# Patient Record
Sex: Male | Born: 1970 | Race: White | Hispanic: No | Marital: Single | Smoking: Never smoker
Health system: Southern US, Community
[De-identification: ages and names within clinical notes are randomized; demographics above are authoritative.]

## PROBLEM LIST (undated history)

## (undated) DIAGNOSIS — I10 Essential (primary) hypertension: Secondary | ICD-10-CM

## (undated) HISTORY — PX: ADENOIDECTOMY: SUR15

---
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
## 2005-07-22 ENCOUNTER — Emergency Department: Payer: Self-pay | Admitting: General Practice

## 2014-07-31 ENCOUNTER — Ambulatory Visit: Payer: Self-pay | Admitting: Family Medicine

## 2014-08-20 ENCOUNTER — Ambulatory Visit: Payer: Self-pay | Admitting: Family Medicine

## 2015-03-04 DIAGNOSIS — M545 Low back pain, unspecified: Secondary | ICD-10-CM | POA: Insufficient documentation

## 2015-08-28 ENCOUNTER — Telehealth: Payer: Self-pay

## 2015-08-28 ENCOUNTER — Ambulatory Visit
Admission: RE | Admit: 2015-08-28 | Discharge: 2015-08-28 | Disposition: A | Discharge status: Home / Self Care | Payer: BLUE CROSS/BLUE SHIELD | Source: Ambulatory Visit | Attending: Family Medicine | Admitting: Family Medicine

## 2015-08-28 ENCOUNTER — Ambulatory Visit (INDEPENDENT_AMBULATORY_CARE_PROVIDER_SITE_OTHER): Payer: BLUE CROSS/BLUE SHIELD | Admitting: Family Medicine

## 2015-08-28 ENCOUNTER — Other Ambulatory Visit: Payer: Self-pay | Admitting: Family Medicine

## 2015-08-28 ENCOUNTER — Encounter: Payer: Self-pay | Admitting: Family Medicine

## 2015-08-28 VITALS — BP 152/90 | HR 95 | Temp 98.2°F | Resp 16 | Wt 183.4 lb

## 2015-08-28 DIAGNOSIS — M5030 Other cervical disc degeneration, unspecified cervical region: Secondary | ICD-10-CM | POA: Insufficient documentation

## 2015-08-28 DIAGNOSIS — M542 Cervicalgia: Secondary | ICD-10-CM

## 2015-08-28 MED ORDER — ETODOLAC 200 MG PO CAPS: 200.0000 mg | ORAL_CAPSULE | Freq: Three times a day (TID) | ORAL | Status: DC

## 2015-08-28 MED ORDER — METHOCARBAMOL 750 MG PO TABS: 750.0000 mg | ORAL_TABLET | Freq: Four times a day (QID) | ORAL | Status: DC

## 2015-08-28 NOTE — Telephone Encounter (Signed)
Pt called because [facility] advised that [facility] didn't receive the RX for etodolac (LODINE) 200 MG capsule. [facility] called [facility] and was advised they didn't receive this RX but they did receive the other RX that was sent in for pt. [facility] stated that their e-scribe was messed up and asked that it be faxed in or called in and left on the voicemail. Thanks TNP

## 2015-08-28 NOTE — Patient Instructions (Signed)
Acute Torticollis °Torticollis is a condition in which the muscles of the neck tighten (contract) abnormally, causing the neck to twist and the head to move into an unnatural position. Torticollis that develops suddenly is called acute torticollis. If torticollis becomes chronic and is left untreated, the face and neck can become deformed. °CAUSES °This condition may be caused by: °· Sleeping in an awkward position (common). °· Extending or twisting the neck muscles beyond their normal position. °· Infection. °In some cases, the cause may not be known. °SYMPTOMS °Symptoms of this condition include: °· An unnatural position of the head. °· Neck pain. °· A limited ability to move the neck. °· Twisting of the neck to one side. °DIAGNOSIS °This condition is diagnosed with a physical exam. You may also have imaging tests, such as an X-ray, CT scan, or MRI. °TREATMENT °Treatment for this condition involves trying to relax the neck muscles. It may include: °· Medicines or shots. °· Physical therapy. °· Surgery. This may be done in severe cases. °HOME CARE INSTRUCTIONS °· Take medicines only as directed by your health care provider. °· Do stretching exercises and massage your neck as directed by your health care provider. °· Keep all follow-up visits as directed by your health care provider. This is important. °SEEK MEDICAL CARE IF: °· You develop a fever. °SEEK IMMEDIATE MEDICAL CARE IF: °· You develop difficulty breathing. °· You develop noisy breathing (stridor). °· You start drooling. °· You have trouble swallowing or have pain with swallowing. °· You develop numbness or weakness in your hands or feet. °· You have changes in your speech, understanding, or vision. °· Your pain gets worse. °  °This information is not intended to replace advice given to you by your health care provider. Make sure you discuss any questions you have with your health care provider. °  °Document Released: 07/08/2000 Document Revised:  11/25/2014 Document Reviewed: 07/07/2014 °Elsevier Interactive Patient Education ©2016 Elsevier Inc. ° °

## 2015-08-28 NOTE — Progress Notes (Signed)
Patient ID: Russell Simon, male   DOB: 02/13/1971, 45 y.o.   MRN: [id number]   Patient: Russell Simon Male    DOB: [birth year]   45 y.o.   MRN: [id number] Visit Date: 08/28/2015  Today's Provider: Dortha Kern, PA   Chief Complaint  Patient presents with  . Neck Pain   Subjective:    Neck Pain  This is a new problem. Episode onset: two days. The problem occurs constantly. The problem has been unchanged. The pain is associated with nothing. The pain is present in the midline. The quality of the pain is described as aching and shooting. The pain is at a severity of 7/10. The symptoms are aggravated by sneezing, bending, position and twisting (movements). The pain is same all the time. Stiffness is present all day. Pertinent negatives include no numbness, paresis or trouble swallowing. He has tried heat and NSAIDs for the symptoms. The treatment provided mild relief.  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 Has been at this job the past couple years.  Patient Active Problem List   Diagnosis Date Noted  . LBP (low back pain) 03/04/2015   Past Surgical History  Procedure Laterality Date  . Tonsillectomy and adenoidectomy  1983   Family History  Problem Relation Age of Onset  . Hypertension Mother   . Hypertension Father   . Urolithiasis Father   . Aneurysm Maternal Grandmother   . Diabetes Maternal Grandfather   . CVA Paternal Grandmother   . Lung cancer Paternal Grandfather      Previous Medications   No medications on file   No Known Allergies  Review of Systems  Constitutional: Negative.   HENT: Negative.  Negative for trouble swallowing.   Eyes: Negative.   Respiratory: Negative.   Cardiovascular: Negative.   Gastrointestinal: Negative.   Endocrine: Negative.   Genitourinary: Negative.   Musculoskeletal: Positive for neck pain.  Skin: Negative.   Allergic/Immunologic: Negative.   Neurological: Negative.  Negative for  numbness.  Hematological: Negative.   Psychiatric/Behavioral: Negative.     Social History  Substance Use Topics  . Smoking status: Never Smoker   . Smokeless tobacco: Never Used  . Alcohol Use: No   Objective:   BP 152/90 mmHg  Pulse 95  Temp(Src) 98.2 F (36.8 C) (Oral)  Resp 16  Wt 183 lb 6.4 oz (83.19 kg)  SpO2 98%  Physical Exam  Constitutional: He is oriented to person, place, and time. He appears well-developed and well-nourished.  HENT:  Head: Normocephalic.  Right Ear: External ear normal.  Left Ear: External ear normal.  Mouth/Throat: Oropharynx is clear and moist.  Eyes: Conjunctivae and EOM are normal.  Neck:  Stiff and slightly sore to palpate posterior neck and shoulder muscles. Unable to rotate head very far without spasms.  Cardiovascular: Normal rate and regular rhythm.   Pulmonary/Chest: Effort normal and breath sounds normal.  Abdominal: Soft. Bowel sounds are normal.  Musculoskeletal: He exhibits tenderness.  Cervical pain. No radiation of pain or numbness in arms or hands.  Lymphadenopathy:    He has no cervical adenopathy.  Neurological: He is alert and oriented to person, place, and time. He has normal reflexes.  Skin: No rash noted.      Assessment & Plan:     1. Cervical pain Onset over the past couple days. No specific injury known. Spasms and sharp pain worse with movement. History of some mild degenerative changes of cervical facets and end  plate spurring without disc space narrowing on C-spine x-ray on 08-20-14. Will give muscle relaxant and NSAID to use with heat applications and rest. No work for the next 4 days. Recheck C-spine x-ray this year for comparison and evaluate for progression of degenerative changes. Recheck pending x-ray report. - methocarbamol (ROBAXIN) 750 MG tablet; Take 1 tablet (750 mg total) by mouth 4 (four) times daily.  Dispense: 40 tablet; Refill: 0 - etodolac (LODINE) 200 MG capsule; Take 1 capsule (200 mg total) by  mouth every 8 (eight) hours.  Dispense: 30 capsule; Refill: 3 - DG Cervical Spine Complete

## 2015-08-28 NOTE — Telephone Encounter (Signed)
-----   Message from Tamsen Roers, [place] sent at 08/28/2015  1:47 PM EST ----- X-rays of neck essentially the same as last year. Try medications as prescribed and recheck if no better in 4-5 days.

## 2015-08-28 NOTE — Telephone Encounter (Signed)
Patient advised as directed below. Patient verbalized understanding.  

## 2015-08-28 NOTE — Telephone Encounter (Signed)
[facility]

## 2016-03-25 ENCOUNTER — Encounter: Payer: Self-pay | Admitting: Family Medicine

## 2016-03-25 ENCOUNTER — Ambulatory Visit (INDEPENDENT_AMBULATORY_CARE_PROVIDER_SITE_OTHER): Payer: BLUE CROSS/BLUE SHIELD | Admitting: Family Medicine

## 2016-03-25 VITALS — BP 122/70 | HR 66 | Temp 97.9°F | Resp 16 | Wt 176.8 lb

## 2016-03-25 DIAGNOSIS — H109 Unspecified conjunctivitis: Secondary | ICD-10-CM | POA: Diagnosis not present

## 2016-03-25 MED ORDER — SULFACETAMIDE SODIUM 10 % OP SOLN: 2.0000 [drp] | Freq: Four times a day (QID) | OPHTHALMIC | 0 refills | Status: DC

## 2016-03-25 NOTE — Progress Notes (Signed)
Subjective:     Patient ID: Russell Simon, male   DOB: 1971/06/08, 45 y.o.   MRN: [id number]  HPI  Chief Complaint  Patient presents with  . Conjunctivitis    Patient comes into office today with concerns of itching and redness of left eye for the past 2 days. Patient reports that he has had drainage and crusting of lid.  States he was not wearing his contacts prior to the onset of his sx. No changes in vision or contact with children. Has used Visine on one occasion.   Review of Systems     Objective:   Physical Exam  Constitutional: He appears well-developed and well-nourished. No distress.  Eyes: Pupils are equal, round, and reactive to light.  Left eye without drainage or f.b. Mild scleral injection       Assessment:    1. Conjunctivitis of left eye - sulfacetamide (BLEPH-10) 10 % ophthalmic solution; Place 2 drops into the left eye 4 (four) times daily.  Dispense: 15 mL; Refill: 0    Plan:    Discussed use of frequent warm compresses.

## 2016-03-25 NOTE — Patient Instructions (Addendum)
Discussed use of frequent warm, wet, compresses

## 2016-07-06 ENCOUNTER — Encounter: Payer: Self-pay | Admitting: Family Medicine

## 2016-07-06 ENCOUNTER — Ambulatory Visit (INDEPENDENT_AMBULATORY_CARE_PROVIDER_SITE_OTHER): Payer: BLUE CROSS/BLUE SHIELD | Admitting: Family Medicine

## 2016-07-06 VITALS — BP 138/98 | HR 85 | Temp 98.1°F | Resp 17 | Wt 178.0 lb

## 2016-07-06 DIAGNOSIS — J069 Acute upper respiratory infection, unspecified: Secondary | ICD-10-CM

## 2016-07-06 DIAGNOSIS — B9789 Other viral agents as the cause of diseases classified elsewhere: Secondary | ICD-10-CM | POA: Diagnosis not present

## 2016-07-06 MED ORDER — HYDROCODONE-HOMATROPINE 5-1.5 MG/5ML PO SYRP: ORAL_SOLUTION | ORAL | 0 refills | Status: DC

## 2016-07-06 NOTE — Progress Notes (Signed)
Subjective:     Patient ID: Russell Simon, male   DOB: [birth year], 45 y.o.   MRN: [id number]  HPI  Chief Complaint  Patient presents with  . Cough    Patient comes in office today with concerns of cough and congestion for the past week and half. Associated with cough patient complains of fatigue, he has taken otc Alka-Seltzer Cold and Flu for relief. Patient reports he was seen at [facility] when symptoms initially began and was prescribed Lidocaine and Tessalon.   States sinuses are clearing and cough is minimally productive. States he left early from work last night.   Review of Systems     Objective:   Physical Exam  Constitutional: He appears well-developed and well-nourished. No distress.  Ears: T.M's intact without inflammation Sinuses: non-tender Throat:tonsils absent Neck: no cervical adenopathy Lungs: clear     Assessment:    1. Viral upper respiratory tract infection - HYDROcodone-homatropine (HYCODAN) 5-1.5 MG/5ML syrup; 5 ml 4-6 hours as needed for cough  Dispense: 240 mL; Refill: 0    Plan:    Discussed use of Mucinex D and Delsym. Work excuse provided for 12/12 to 12/13.

## 2016-07-06 NOTE — Patient Instructions (Signed)
Add Mucinex D for residual congestion and Delsym for cough if not using hydrocodone.

## 2016-07-22 ENCOUNTER — Other Ambulatory Visit: Payer: Self-pay | Admitting: Family Medicine

## 2016-07-22 ENCOUNTER — Telehealth: Payer: Self-pay | Admitting: Family Medicine

## 2016-07-22 DIAGNOSIS — J40 Bronchitis, not specified as acute or chronic: Secondary | ICD-10-CM

## 2016-07-22 MED ORDER — AZITHROMYCIN 250 MG PO TABS: ORAL_TABLET | ORAL | 0 refills | Status: DC

## 2016-07-22 NOTE — Telephone Encounter (Signed)
Let him know I sent in an antibiotic to his pharmacy

## 2016-07-22 NOTE — Telephone Encounter (Signed)
Patient advised.

## 2016-07-22 NOTE — Telephone Encounter (Signed)
Pt was in a couple of weeks ago and seen Nadine CountsBob for cough and congestio.  He is still coughing and wants to know if there is anything else he can do.  He is still taking the cough rx hydocan  His call back is [phone]  [facility]  Thanks Barth Kirkseri

## 2017-02-16 ENCOUNTER — Ambulatory Visit: Payer: BLUE CROSS/BLUE SHIELD | Admitting: Family Medicine

## 2017-02-16 ENCOUNTER — Encounter: Payer: Self-pay | Admitting: Family Medicine

## 2017-02-16 ENCOUNTER — Other Ambulatory Visit: Payer: Self-pay | Admitting: Family Medicine

## 2017-02-16 ENCOUNTER — Ambulatory Visit (INDEPENDENT_AMBULATORY_CARE_PROVIDER_SITE_OTHER): Payer: BLUE CROSS/BLUE SHIELD | Admitting: Family Medicine

## 2017-02-16 VITALS — BP 144/102 | HR 79 | Temp 98.4°F | Resp 16 | Wt 170.2 lb

## 2017-02-16 DIAGNOSIS — L309 Dermatitis, unspecified: Secondary | ICD-10-CM | POA: Diagnosis not present

## 2017-02-16 MED ORDER — TRIAMCINOLONE ACETONIDE 0.1 % EX CREA: 1.0000 "application " | TOPICAL_CREAM | Freq: Two times a day (BID) | CUTANEOUS | 0 refills | Status: DC

## 2017-02-16 NOTE — Progress Notes (Signed)
Subjective:     Patient ID: Russell Simon, male   DOB: [birth year], 46 y.o.   MRN: [id number]  HPI  Chief Complaint  Patient presents with  . Rash    Patient comes in office today with concerns of a possible rash that appeared on his ankles 6 days ago. Patient describes rash as burning and denies that it has spreaded.   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 Wears low level boots with ankle socks. Rash appeared after his third shift on 7/20. Feels like it resolved over the weekend then recurred when he returned to work. States he does not spend time outside and has not had insect exposure.   Review of Systems     Objective:   Physical Exam  Constitutional: He appears well-developed and well-nourished. No distress.  Skin:  Bilateral distal lower legs above the ankles with band like rash with irregular borders no scaling. Has another patch on the lower third of his left leg.       Assessment:    1. Dermatitis; ? From irritation from clothing - triamcinolone cream (KENALOG) 0.1 %; Apply 1 application topically 2 (two) times daily.  Dispense: 30 g; Refill: 0    Plan:    Call if not improving for dermatology referral.

## 2017-02-16 NOTE — Patient Instructions (Signed)
Let me know if your rash does not improve and we will send you to a dermatologist.

## 2017-04-01 ENCOUNTER — Emergency Department
Admission: EM | Admit: 2017-04-01 | Discharge: 2017-04-01 | Disposition: A | Discharge status: Home / Self Care | Payer: BLUE CROSS/BLUE SHIELD | Attending: Emergency Medicine | Admitting: Emergency Medicine

## 2017-04-01 ENCOUNTER — Encounter: Payer: Self-pay | Admitting: Emergency Medicine

## 2017-04-01 DIAGNOSIS — I10 Essential (primary) hypertension: Secondary | ICD-10-CM | POA: Insufficient documentation

## 2017-04-01 DIAGNOSIS — Z79899 Other long term (current) drug therapy: Secondary | ICD-10-CM | POA: Diagnosis not present

## 2017-04-01 DIAGNOSIS — M62830 Muscle spasm of back: Secondary | ICD-10-CM

## 2017-04-01 MED ORDER — HYDROCHLOROTHIAZIDE 12.5 MG PO TABS: 12.5000 mg | ORAL_TABLET | Freq: Every day | ORAL | 0 refills | Status: DC

## 2017-04-01 MED ORDER — HYDROCHLOROTHIAZIDE 12.5 MG PO CAPS
12.5000 mg | ORAL_CAPSULE | Freq: Every day | ORAL | Status: DC
  Administered 2017-04-01: 12.5 mg via ORAL
  Filled 2017-04-01: qty 1

## 2017-04-01 MED ORDER — CYCLOBENZAPRINE HCL 10 MG PO TABS
5.0000 mg | ORAL_TABLET | Freq: Once | ORAL | Status: AC
  Administered 2017-04-01: 5 mg via ORAL
  Filled 2017-04-01: qty 1

## 2017-04-01 MED ORDER — CYCLOBENZAPRINE HCL 5 MG PO TABS: 5.0000 mg | ORAL_TABLET | Freq: Three times a day (TID) | ORAL | 0 refills | Status: DC | PRN

## 2017-04-01 NOTE — ED Notes (Signed)
Pt alert and oriented X4, active, cooperative, pt in NAD. RR even and unlabored, color WNL.  Pt informed to return if any life threatening symptoms occur.   

## 2017-04-01 NOTE — ED Provider Notes (Signed)
[facility]-EMERGENCY DEPARTMENT Provider Note   CSN: [id number] Arrival date & time: 04/01/17  1424     History   Chief Complaint Chief Complaint  Patient presents with  . Hypertension    HPI Russell Simon is a 46 y.o. male presents to the emergency department for evaluation of high blood pressure. Patient states he was seen in urgent care yesterday for back pain, diagnosed with degenerative disc disease. Denies any trauma or injury. States that he is having significant muscle tightness and spasms on the right lower lumbar spine that is intermittent, comes and goes with occasional numbness and tingling on his right leg. He denies any weakness, swelling in the lower extremities. No loss of bowel or bladder symptoms. Urgent care facility did not prescribe him with any medications due to high blood pressure medication. He was told to follow up with PCP and go to the emergency departmen if blood pressure continued to be elevated.patient checked his blood pressure home, blood pressure was around 160/110. Patient decided to come to the emergency department. Patient states his back pain is 7 out of 10. He has taken 500 mg of Tylenol which has helpd ease the pas ambulatory with no assistive devices. He also notes he has a mild headache that has been improving, currently 2 out of 10 since taking the Tylenol. He denies any abdominal pain, chest pain, shortness of breath, vision changes. Patient states this is his normal headache that he will occasionally get.  HPI  History reviewed. No pertinent past medical history.  Patient Active Problem List   Diagnosis Date Noted  . LBP (low back pain) 03/04/2015    Past Surgical History:  Procedure Laterality Date  . TONSILLECTOMY AND ADENOIDECTOMY  1983       Home Medications    Prior to Admission medications   Medication Sig Start Date End Date Taking? Authorizing Provider  cyclobenzaprine (FLEXERIL) 5 MG tablet Take 1-2 tablets (5-10 mg total)  by mouth 3 (three) times daily as needed for muscle spasms. 04/01/17   Evon SlackGaines, Thomas C, PA-C  hydrochlorothiazide (HYDRODIURIL) 12.5 MG tablet Take 1 tablet (12.5 mg total) by mouth daily. 04/01/17   Evon SlackGaines, Thomas C, PA-C  triamcinolone cream (KENALOG) 0.1 % Apply 1 application topically 2 (two) times daily. 02/16/17   Anola Gurneyhauvin, Robert, PA    Family History Family History  Problem Relation Age of Onset  . Aneurysm Maternal Grandmother   . Diabetes Maternal Grandfather   . CVA Paternal Grandmother   . Lung cancer Paternal Grandfather   . Hypertension Mother   . Hypertension Father   . Urolithiasis Father     Social History Social History  Substance Use Topics  . Smoking status: Never Smoker  . Smokeless tobacco: Never Used  . Alcohol use No     Allergies   Patient has no known allergies.   Review of Systems Review of Systems  Constitutional: Negative.  Negative for activity change, appetite change, chills and fever.  HENT: Negative for congestion, ear pain, mouth sores, rhinorrhea, sinus pressure, sore throat and trouble swallowing.   Eyes: Negative for photophobia, pain, discharge and visual disturbance.  Respiratory: Negative for cough, chest tightness and shortness of breath.   Cardiovascular: Negative for chest pain and leg swelling.  Gastrointestinal: Negative for abdominal distention, abdominal pain, diarrhea, nausea and vomiting.  Genitourinary: Negative for difficulty urinating and dysuria.  Musculoskeletal: Positive for back pain. Negative for arthralgias, gait problem and myalgias.  Skin: Negative for color change and rash.  Neurological: Positive for headaches. Negative for dizziness.  Hematological: Negative for adenopathy.  Psychiatric/Behavioral: Negative for agitation and behavioral problems.     Physical Exam Updated Vital Signs BP (!) 149/88 (BP Location: Right Arm)   Pulse 72   Temp 98.7 F (37.1 C) (Oral)   Resp 18   Wt 77.1 kg (170 lb)   SpO2 97%    BMI 26.23 kg/m   Physical Exam  Constitutional: He is oriented to person, place, and time. He appears well-developed and well-nourished.  HENT:  Head: Normocephalic and atraumatic.  Right Ear: External ear normal.  Left Ear: External ear normal.  Mouth/Throat: Oropharynx is clear and moist. No oropharyngeal exudate.  Eyes: Pupils are equal, round, and reactive to light. Conjunctivae and EOM are normal. Right eye exhibits no discharge. Left eye exhibits no discharge.  Neck: Normal range of motion. Neck supple.  Cardiovascular: Normal rate, regular rhythm, normal heart sounds and intact distal pulses.   No murmur heard. Pulmonary/Chest: Effort normal and breath sounds normal. No respiratory distress. He has no wheezes. He has no rales. He exhibits no tenderness.  Abdominal: Soft. Bowel sounds are normal. He exhibits no distension and no mass. There is no tenderness. There is no rebound and no guarding. No hernia.  No bruits  Musculoskeletal: Normal range of motion. He exhibits tenderness. He exhibits no edema.  Patient with mild tenderness along the right paravertebral muscles of the right lumbar spine. Muscle spasm noted. No pain with hip internal or external rotation. He is nervous and intact in bilateral lower extremities with no signs of swelling or edema in the lower extremities.  Neurological: He is alert and oriented to person, place, and time. No cranial nerve deficit. Coordination normal.  Skin: Skin is warm and dry.  Psychiatric: He has a normal mood and affect. His behavior is normal. Judgment and thought content normal.     ED Treatments / Results  Labs (all labs ordered are listed, but only abnormal results are displayed) Labs Reviewed - No data to display  EKG  EKG Interpretation None       Radiology No results found.  Procedures Procedures (including critical care time)  Medications Ordered in ED Medications  hydrochlorothiazide (MICROZIDE) capsule 12.5 mg  (12.5 mg Oral Given 04/01/17 1557)  cyclobenzaprine (FLEXERIL) tablet 5 mg (5 mg Oral Given 04/01/17 1557)     Initial Impression / Assessment and Plan / ED Course  I have reviewed the triage vital signs and the nursing notes.  Pertinent labs & imaging results that were available during my care of the patient were reviewed by me and considered in my medical decision making (see chart for details).     46 year old male with hypertension, right lower lumbar muscle spasms. Blood pressure 149/88 just prior to discharge with no medications given. Patient is started on a low-dose 12.5 mg hydrochlorothiazide. He will start Flexeril 5-10 mg 3 times a day when necessary muscle spasms. He will continue with Tylenol as needed for pain. He is educated onhypertension, signs and symptoms return to the ED for. He'll continue to record blood pressures at home, follow up with primary care provider next week.  Final Clinical Impressions(s) / ED Diagnoses   Final diagnoses:  Essential hypertension  Lumbar paraspinal muscle spasm    New Prescriptions New Prescriptions   CYCLOBENZAPRINE (FLEXERIL) 5 MG TABLET    Take 1-2 tablets (5-10 mg total) by mouth 3 (three) times daily as needed for muscle spasms.   HYDROCHLOROTHIAZIDE (HYDRODIURIL)  12.5 MG TABLET    Take 1 tablet (12.5 mg total) by mouth daily.     Evon Slack, PA-C 04/01/17 1606    Jeanmarie Plant, MD 04/02/17 [phone]

## 2017-04-01 NOTE — Discharge Instructions (Signed)
Please take medications as prescribed and return to the emergency department for any severe headache, vision changes, chest pain, shortness of breath, abdominal pain. Please avoid alcohol, salty foods. Continue to monitor your blood pressure twice daily and record. Pressures. Please follow-up with Dr. Yetta Numbershrisman next week.

## 2017-04-01 NOTE — ED Triage Notes (Addendum)
Pt to ed with c/o HTN that started last night.  Pt reports he was seen at urgent care for back pain and told his blood pressure was elevated.  Denies chest pain. Reports headache.  Spoke with Dr Marisa Severinsiadecki and no orders received for pt.  Advised per dr that he can be seen in [facility].

## 2017-04-03 ENCOUNTER — Ambulatory Visit (INDEPENDENT_AMBULATORY_CARE_PROVIDER_SITE_OTHER): Payer: BLUE CROSS/BLUE SHIELD | Admitting: Family Medicine

## 2017-04-03 ENCOUNTER — Encounter: Payer: Self-pay | Admitting: Family Medicine

## 2017-04-03 VITALS — BP 140/88 | HR 92 | Temp 98.7°F | Ht 67.0 in | Wt 172.2 lb

## 2017-04-03 DIAGNOSIS — M545 Low back pain: Secondary | ICD-10-CM

## 2017-04-03 DIAGNOSIS — I10 Essential (primary) hypertension: Secondary | ICD-10-CM | POA: Diagnosis not present

## 2017-04-03 NOTE — Progress Notes (Signed)
Patient: Russell SizerWilliam C Simon Male    DOB: Nov 24, 1970   46 y.o.   MRN: [id number] Visit Date: 04/03/2017  Today's Provider: Dortha Kernennis Sidharth Leverette, PA   Chief Complaint  Patient presents with  . ER follow up   Subjective:    HPI  Follow up ER visit  Patient was seen in ER for back pain and elevated blood pressure on 04/01/2017. He was treated for Essential Hypertension and Lumbar paraspinal muscle spasms . Treatment for this included started HCTZ 12.5 mg and Flexeril 5-10 mg TID PRN . He reports good compliance with treatment. He reports this condition is slowly improving.   ------------------------------------------------------------------------------------  No past medical history on file. Patient Active Problem List   Diagnosis Date Noted  . LBP (low back pain) 03/04/2015   Past Surgical History:  Procedure Laterality Date  . TONSILLECTOMY AND ADENOIDECTOMY  1983   Family History  Problem Relation Age of Onset  . Aneurysm Maternal Grandmother   . Diabetes Maternal Grandfather   . CVA Paternal Grandmother   . Lung cancer Paternal Grandfather   . Hypertension Mother   . Hypertension Father   . Urolithiasis Father    No Known Allergies   Previous Medications   CYCLOBENZAPRINE (FLEXERIL) 5 MG TABLET    Take 1-2 tablets (5-10 mg total) by mouth 3 (three) times daily as needed for muscle spasms.   HYDROCHLOROTHIAZIDE (HYDRODIURIL) 12.5 MG TABLET    Take 1 tablet (12.5 mg total) by mouth daily.   TRIAMCINOLONE CREAM (KENALOG) 0.1 %    Apply 1 application topically 2 (two) times daily.   Review of Systems  Constitutional: Negative.   Respiratory: Negative.   Cardiovascular: Negative.   Musculoskeletal: Positive for back pain.   Social History  Substance Use Topics  . Smoking status: Never Smoker  . Smokeless tobacco: Never Used  . Alcohol use No   Objective:   BP 140/88 (BP Location: Right Arm, Patient Position: Sitting, Cuff Size: Normal)   Pulse 92   Temp 98.7 F  (37.1 C) (Oral)   Ht 5\' 7"  (1.702 m)   Wt 172 lb 3.2 oz (78.1 kg)   SpO2 96%   BMI 26.97 kg/m  BP Readings from Last 3 Encounters:  04/03/17 140/88  04/01/17 (!) 149/88  02/16/17 (!) 144/102    Physical Exam  Constitutional: He appears well-developed and well-nourished.  HENT:  Head: Normocephalic.  Right Ear: External ear normal.  Mouth/Throat: Oropharynx is clear and moist.  Eyes: Pupils are equal, round, and reactive to light. Conjunctivae are normal.  Neck: Neck supple. No thyromegaly present.  Cardiovascular: Normal rate and regular rhythm.   Pulmonary/Chest: Effort normal and breath sounds normal.  Abdominal: Soft. Bowel sounds are normal.  Musculoskeletal: Normal range of motion.  Slight soreness in the right lower back. Fair ROM and no weakness.      Assessment & Plan:     1. Essential hypertension Went to the ER on 04-01-17 with BP 149/88 and started on HCUZ 12.5 mg qd. Has a history of BP being as high as 160/110 in an Urgent Care Clinic on 03-31-17 and 144/102 on 02-17-87. Will check labs and continue BP medication. Recheck progress in 3.5 weeks. - CBC with Differential/Platelet - Comprehensive metabolic panel - TSH  2. Acute right-sided low back pain, with sciatica presence unspecified No specific injury known. Works lifting boxes occasionally. Lumbar spine x-rays on 07-31-14 showed L5-S1 degenerative changes. Diagnosed with lumbar paraspinal muscle spasm  In the ER on 04-01-17.  Continue Cyclobenzaprine and moist heat applications. Limit lifting to 15-20 lbs and recheck as needed.

## 2017-04-04 ENCOUNTER — Telehealth: Payer: Self-pay

## 2017-04-04 LAB — COMPREHENSIVE METABOLIC PANEL
AG Ratio: 1.8 (calc) (ref 1.0–2.5)
ALBUMIN MSPROF: 4.8 g/dL (ref 3.6–5.1)
ALT: 24 U/L (ref 9–46)
AST: 18 U/L (ref 10–40)
Alkaline phosphatase (APISO): 71 U/L (ref 40–115)
BUN: 14 mg/dL (ref 7–25)
CHLORIDE: 101 mmol/L (ref 98–110)
CO2: 29 mmol/L (ref 20–32)
CREATININE: 1.18 mg/dL (ref 0.60–1.35)
Calcium: 10.1 mg/dL (ref 8.6–10.3)
GLOBULIN: 2.6 g/dL (ref 1.9–3.7)
GLUCOSE: 86 mg/dL (ref 65–99)
POTASSIUM: 4.2 mmol/L (ref 3.5–5.3)
Sodium: 138 mmol/L (ref 135–146)
TOTAL PROTEIN: 7.4 g/dL (ref 6.1–8.1)
Total Bilirubin: 0.6 mg/dL (ref 0.2–1.2)

## 2017-04-04 LAB — TSH: TSH: 1.51 mIU/L (ref 0.40–4.50)

## 2017-04-04 LAB — CBC WITH DIFFERENTIAL/PLATELET
BASOS PCT: 0.5 %
Basophils Absolute: 28 cells/uL (ref 0–200)
EOS PCT: 3.4 %
Eosinophils Absolute: 190 cells/uL (ref 15–500)
HCT: 50.6 % — ABNORMAL HIGH (ref 38.5–50.0)
Hemoglobin: 17.4 g/dL — ABNORMAL HIGH (ref 13.2–17.1)
Lymphs Abs: 2033 cells/uL (ref 850–3900)
MCH: 29.1 pg (ref 27.0–33.0)
MCHC: 34.4 g/dL (ref 32.0–36.0)
MCV: 84.8 fL (ref 80.0–100.0)
MPV: 10.5 fL (ref 7.5–12.5)
Monocytes Relative: 10.8 %
Neutro Abs: 2744 cells/uL (ref 1500–7800)
Neutrophils Relative %: 49 %
PLATELETS: 166 10*3/uL (ref 140–400)
RBC: 5.97 10*6/uL — AB (ref 4.20–5.80)
RDW: 14 % (ref 11.0–15.0)
TOTAL LYMPHOCYTE: 36.3 %
WBC: 5.6 10*3/uL (ref 3.8–10.8)
WBCMIX: 605 {cells}/uL (ref 200–950)

## 2017-04-04 NOTE — Telephone Encounter (Signed)
Patient advised.

## 2017-04-04 NOTE — Telephone Encounter (Signed)
-----   Message from Tamsen Roersennis E Chrismon, [place] sent at 04/04/2017  8:15 AM EDT ----- Blood tests normal except RBC's, hemoglobin and hematocrit slightly elevated. Lack of enough water in diet and smoking (of any kind) can cause these changes. Recheck level in 3 months. Recheck BP as planned.

## 2017-04-04 NOTE — Telephone Encounter (Signed)
LMTCB

## 2017-04-06 ENCOUNTER — Encounter: Payer: Self-pay | Admitting: Family Medicine

## 2017-04-06 ENCOUNTER — Ambulatory Visit (INDEPENDENT_AMBULATORY_CARE_PROVIDER_SITE_OTHER): Payer: BLUE CROSS/BLUE SHIELD | Admitting: Family Medicine

## 2017-04-06 VITALS — BP 128/86 | HR 95 | Temp 98.4°F | Wt 172.2 lb

## 2017-04-06 DIAGNOSIS — I10 Essential (primary) hypertension: Secondary | ICD-10-CM

## 2017-04-06 DIAGNOSIS — M545 Low back pain: Secondary | ICD-10-CM

## 2017-04-06 MED ORDER — CYCLOBENZAPRINE HCL 5 MG PO TABS: 5.0000 mg | ORAL_TABLET | Freq: Three times a day (TID) | ORAL | 0 refills | Status: DC | PRN

## 2017-04-06 MED ORDER — HYDROCHLOROTHIAZIDE 12.5 MG PO TABS: 12.5000 mg | ORAL_TABLET | Freq: Every day | ORAL | 3 refills | Status: DC

## 2017-04-06 MED ORDER — PREDNISONE 10 MG PO TABS: ORAL_TABLET | ORAL | 0 refills | Status: DC

## 2017-04-06 NOTE — Progress Notes (Signed)
Patient: Russell Simon Male    DOB: September 27, 1970   46 y.o.   MRN: [id number] Visit Date: 04/06/2017  Today's Provider: Dortha Kernennis Chrismon, PA   Chief Complaint  Patient presents with  . Back Pain   Subjective:    Back Pain  This is a new problem. Episode onset: 6 days ago. The problem occurs constantly. The problem has been gradually worsening since onset. The pain is present in the lumbar spine. The quality of the pain is described as shooting. Radiates to: right leg and right shoulder. The pain is at a severity of 7/10. Exacerbated by: movement. Associated symptoms include leg pain and tingling. Treatments tried: ER prescribed Flexeril 5 mg on 04/01/17. The treatment provided no relief.   Patient Active Problem List   Diagnosis Date Noted  . Essential hypertension 04/03/2017  . Low back pain 03/04/2015   Past Surgical History:  Procedure Laterality Date  . TONSILLECTOMY AND ADENOIDECTOMY  1983   Family History  Problem Relation Age of Onset  . Aneurysm Maternal Grandmother   . Diabetes Maternal Grandfather   . CVA Paternal Grandmother   . Lung cancer Paternal Grandfather   . Hypertension Mother   . Hypertension Father   . Urolithiasis Father    No Known Allergies   Previous Medications   CYCLOBENZAPRINE (FLEXERIL) 5 MG TABLET    Take 1-2 tablets (5-10 mg total) by mouth 3 (three) times daily as needed for muscle spasms.   HYDROCHLOROTHIAZIDE (HYDRODIURIL) 12.5 MG TABLET    Take 1 tablet (12.5 mg total) by mouth daily.   TRIAMCINOLONE CREAM (KENALOG) 0.1 %    Apply 1 application topically 2 (two) times daily.    Review of Systems  Constitutional: Negative.   Respiratory: Negative.   Cardiovascular: Negative.   Musculoskeletal: Positive for back pain.  Neurological: Positive for tingling.    Social History  Substance Use Topics  . Smoking status: Never Smoker  . Smokeless tobacco: Never Used  . Alcohol use No   Objective:   BP 128/86 (BP Location: Right Arm,  Patient Position: Sitting, Cuff Size: Normal)   Pulse 95   Temp 98.4 F (36.9 C) (Oral)   Wt 172 lb 3.2 oz (78.1 kg)   SpO2 98%   BMI 26.97 kg/m  BP Readings from Last 3 Encounters:  04/06/17 128/86  04/03/17 140/88  04/01/17 (!) 149/88    Physical Exam  Constitutional: He is oriented to person, place, and time. He appears well-developed and well-nourished. No distress.  HENT:  Head: Normocephalic and atraumatic.  Right Ear: Hearing normal.  Left Ear: Hearing normal.  Nose: Nose normal.  Eyes: Conjunctivae and lids are normal. Right eye exhibits no discharge. Left eye exhibits no discharge. No scleral icterus.  Neck: Neck supple.  Cardiovascular: Normal rate and regular rhythm.   Pulmonary/Chest: Effort normal and breath sounds normal. No respiratory distress.  Abdominal: Soft. Bowel sounds are normal.  Musculoskeletal:  Sharp pains in the right lower back into the right posterior thigh to the knee. SLR's 80 degrees with some spasms.  Neurological: He is alert and oriented to person, place, and time. He has normal reflexes.  Skin: Skin is intact. No lesion and no rash noted.  Psychiatric: He has a normal mood and affect. His speech is normal and behavior is normal. Thought content normal.      Assessment & Plan:     1. Acute right-sided low back pain, with sciatica presence unspecified Continues to have spasms and pain  radiating down the posterior right thigh. May increase Flexeril to 2 tablets TID and add Prednisone taper (states this has helped with back pain in the past). May add Tylenol and OTC TENS unit  [facility]). Out of work for 5 days. Recheck as needed next week. - cyclobenzaprine (FLEXERIL) 5 MG tablet; Take 1-2 tablets (5-10 mg total) by mouth 3 (three) times daily as needed for muscle spasms.  Dispense: 40 tablet; Refill: 0 - predniSONE (DELTASONE) 10 MG tablet; Take by mouth on a taper for 6 days (6,5,4,3,2,1)  Dispense: 21 tablet; Refill: 0  2.  Essential hypertension Well controlled BP since starting the HCTZ. Labs showed some slight increase in Hgb, Hct and RBC count. Recheck BP and CBC in 3 months. - hydrochlorothiazide (HYDRODIURIL) 12.5 MG tablet; Take 1 tablet (12.5 mg total) by mouth daily.  Dispense: 30 tablet; Refill: 3

## 2017-04-06 NOTE — Addendum Note (Signed)
Addended by: Dortha KernHRISMON, DENNIS E on: 04/06/2017 11:55 AM   Modules accepted: Orders

## 2017-04-27 ENCOUNTER — Ambulatory Visit (INDEPENDENT_AMBULATORY_CARE_PROVIDER_SITE_OTHER): Payer: BLUE CROSS/BLUE SHIELD | Admitting: Family Medicine

## 2017-04-27 ENCOUNTER — Encounter: Payer: Self-pay | Admitting: Family Medicine

## 2017-04-27 VITALS — BP 116/88 | HR 87 | Temp 98.5°F | Wt 169.0 lb

## 2017-04-27 DIAGNOSIS — M545 Low back pain: Secondary | ICD-10-CM | POA: Diagnosis not present

## 2017-04-27 DIAGNOSIS — I10 Essential (primary) hypertension: Secondary | ICD-10-CM | POA: Diagnosis not present

## 2017-04-27 NOTE — Progress Notes (Signed)
PatieNOEMI ISHMAELerkeley Male    DOB: [birth year]   46 y.o.   MRN: [id number] Visit Date: 04/27/2017  Today's Provider: Dortha Kern, PA   Chief Complaint  Patient presents with  . Hypertension  . Back Pain  . Follow-up   Subjective:    HPI  Hypertension, follow-up:  BP Readings from Last 3 Encounters:  04/27/17 116/88  04/06/17 128/86  04/03/17 140/88    He was last seen for hypertension 3 weeks ago.  BP at that visit was 128/86. Management changes since that visit include continue HCTZ 12.5 mg. Labs showed elevated Hgb, Hct, and RBC. He was advised to have labs rechecked in 3 weeks. He reports good compliance with treatment .He is not having side effects.    Weight trend: improving a bit Wt Readings from Last 3 Encounters:  04/27/17 169 lb (76.7 kg)  04/06/17 172 lb 3.2 oz (78.1 kg)  04/03/17 172 lb 3.2 oz (78.1 kg)   ------------------------------------------------------------------------  Low Back Pain Follow Up:  Patient was last seen for low back pain 3 weeks ago. He was advised to continue Flexeril, Prednisone taper, Icy Hot and OTC TENS unit. He was given a work excuse for 5 days. Patient reports good compliance with treatment plan. He states symptoms have improved. He completed Prednisone taper and is taking Flexeril PRN  No past medical history on file. Patient Active Problem List   Diagnosis Date Noted  . Essential hypertension 04/03/2017  . Low back pain 03/04/2015   Past Surgical History:  Procedure Laterality Date  . TONSILLECTOMY AND ADENOIDECTOMY  1983   Family History  Problem Relation Age of Onset  . Aneurysm Maternal Grandmother   . Diabetes Maternal Grandfather   . CVA Paternal Grandmother   . Lung cancer Paternal Grandfather   . Hypertension Mother   . Hypertension Father   . Urolithiasis Father    No Known Allergies   Previous Medications   CYCLOBENZAPRINE (FLEXERIL) 5 MG TABLET    Take 1-2 tablets (5-10 mg total) by mouth 3  (three) times daily as needed for muscle spasms.   HYDROCHLOROTHIAZIDE (HYDRODIURIL) 12.5 MG TABLET    Take 1 tablet (12.5 mg total) by mouth daily.   TRIAMCINOLONE CREAM (KENALOG) 0.1 %    Apply 1 application topically 2 (two) times daily.    Review of Systems  Constitutional: Negative.   Respiratory: Negative.   Cardiovascular: Negative.     Social History  Substance Use Topics  . Smoking status: Never Smoker  . Smokeless tobacco: Never Used  . Alcohol use No   Objective:   BP 116/88 (BP Location: Right Arm, Patient Position: Sitting, Cuff Size: Normal)   Pulse 87   Temp 98.5 F (36.9 C) (Oral)   Wt 169 lb (76.7 kg)   SpO2 99%   BMI 26.47 kg/m   Physical Exam  Constitutional: He is oriented to person, place, and time. He appears well-developed and well-nourished. No distress.  HENT:  Head: Normocephalic and atraumatic.  Right Ear: Hearing normal.  Left Ear: Hearing normal.  Nose: Nose normal.  Eyes: Conjunctivae and lids are normal. Right eye exhibits no discharge. Left eye exhibits no discharge. No scleral icterus.  Neck: Neck supple.  Cardiovascular: Normal rate.   Pulmonary/Chest: Effort normal. No respiratory distress.  Abdominal: Soft.  Musculoskeletal: Normal range of motion.  Neurological: He is alert and oriented to person, place, and time. He has normal reflexes.  Skin: Skin is intact. No lesion and no rash  noted.  Psychiatric: He has a normal mood and affect. His speech is normal and behavior is normal. Thought content normal.      Assessment & Plan:     1. Acute right-sided low back pain, with sciatica presence unspecified Greatly improved. Went back to work on 04-10-17 and finished all the prednisone taper. No longer having spasms and able to flex/bend without pain. Completed note for work (was out from 03-31-17 due to hypertension and acute back pain). Continue rehab exercises and OTC NSAID or OTC TENS as needed. Completed insurance form from 03-31-17 to  04-10-17.  2. Essential hypertension Well controlled BP without syncope, palpitations or muscle cramps. Continue HCTZ and recheck inn 3 months as planned.

## 2017-05-01 ENCOUNTER — Telehealth: Payer: Self-pay | Admitting: Family Medicine

## 2017-05-01 NOTE — Telephone Encounter (Signed)
Increase Hydrochlorothiazide (Hydrodiuril) 12.5 mg to 2 tablets daily and schedule follow up of BP next week to assess progress. Continue to check BP at home once a day.

## 2017-05-01 NOTE — Telephone Encounter (Signed)
Pt states his BP has been high all weekend.  States it was 140's over high 80's low 90's.  Just took about 10 mins ago and it was 150/96.  Pt states he has been taken medication for his BP for about 3 wks.   Pt want to know if this is normal or should he come back in for another appt?

## 2017-05-02 NOTE — Telephone Encounter (Signed)
Patient advised. Follow up scheduled.  

## 2017-05-11 ENCOUNTER — Ambulatory Visit (INDEPENDENT_AMBULATORY_CARE_PROVIDER_SITE_OTHER): Payer: BLUE CROSS/BLUE SHIELD | Admitting: Family Medicine

## 2017-05-11 ENCOUNTER — Encounter: Payer: Self-pay | Admitting: Family Medicine

## 2017-05-11 VITALS — BP 122/78 | HR 74 | Temp 98.0°F | Wt 167.0 lb

## 2017-05-11 DIAGNOSIS — I10 Essential (primary) hypertension: Secondary | ICD-10-CM

## 2017-05-11 MED ORDER — HYDROCHLOROTHIAZIDE 25 MG PO TABS: 25.0000 mg | ORAL_TABLET | Freq: Every day | ORAL | 3 refills | Status: DC

## 2017-05-11 NOTE — Progress Notes (Signed)
   Patient: Russell SizerWilliam C Simon Male    DOB: 1971/07/21   46 y.o.   MRN: [id number] Visit Date: 05/11/2017  Today's Provider: Dortha Kernennis Jovanka [place]gate, PA   Chief Complaint  Patient presents with  . Hypertension  . Follow-up   Subjective:    HPI  Hypertension, follow-up:  BP Readings from Last 3 Encounters:  05/11/17 122/78  04/27/17 116/88  04/06/17 128/86   He was last seen for hypertension 2 weeks ago.  BP at that visit was 116/88. Management changes since that visit include increasing HCTZ 12.5 mg to 2 tablets daily. Patient contacted office on 05/01/17 to report elevated BP readings at home.  He reports good compliance with treatment . He is not having side effects.    Weight trend: improving a bit    Wt Readings from Last 3 Encounters:  04/27/17 169 lb (76.7 kg)  04/06/17 172 lb 3.2 oz (78.1 kg)  04/03/17 172 lb 3.2 oz (78.1 kg)   ------------------------------------------------------------------------    Previous Medications   CYCLOBENZAPRINE (FLEXERIL) 5 MG TABLET    Take 1-2 tablets (5-10 mg total) by mouth 3 (three) times daily as needed for muscle spasms.   HYDROCHLOROTHIAZIDE (HYDRODIURIL) 12.5 MG TABLET    Take 1 tablet (12.5 mg total) by mouth daily.   TRIAMCINOLONE CREAM (KENALOG) 0.1 %    Apply 1 application topically 2 (two) times daily.    Review of Systems  Constitutional: Negative.   Respiratory: Negative.   Cardiovascular: Negative.     Social History  Substance Use Topics  . Smoking status: Never Smoker  . Smokeless tobacco: Never Used  . Alcohol use No   Objective:   BP 122/78 (BP Location: Right Arm, Patient Position: Sitting, Cuff Size: Normal)   Pulse 74   Temp 98 F (36.7 C) (Oral)   Wt 167 lb (75.8 kg)   SpO2 99%   BMI 26.16 kg/m   Physical Exam  Constitutional: He is oriented to person, place, and time. He appears well-developed and well-nourished. No distress.  HENT:  Head: Normocephalic and atraumatic.  Right Ear: Hearing  normal.  Left Ear: Hearing normal.  Nose: Nose normal.  Eyes: Conjunctivae and lids are normal. Right eye exhibits no discharge. Left eye exhibits no discharge. No scleral icterus.  Neck: Neck supple. No thyromegaly present.  Cardiovascular: Normal rate.   Pulmonary/Chest: Effort normal. No respiratory distress.  Musculoskeletal: Normal range of motion.  Neurological: He is alert and oriented to person, place, and time.  Skin: Skin is intact. No lesion and no rash noted.  Psychiatric: He has a normal mood and affect. His speech is normal and behavior is normal. Thought content normal.      Assessment & Plan:     1. Essential hypertension Well controlled without palpitations, muscle cramps or dizziness. Continue HCTZ 25 mg qd and recheck with fasting labs in 3 months. - hydrochlorothiazide (HYDRODIURIL) 25 MG tablet; Take 1 tablet (25 mg total) by mouth daily.  Dispense: 90 tablet; Refill: 3

## 2017-08-11 ENCOUNTER — Encounter: Payer: Self-pay | Admitting: Family Medicine

## 2017-08-11 ENCOUNTER — Ambulatory Visit: Payer: BLUE CROSS/BLUE SHIELD | Admitting: Family Medicine

## 2017-08-11 VITALS — BP 116/72 | HR 69 | Temp 98.5°F | Wt 174.0 lb

## 2017-08-11 DIAGNOSIS — S39012D Strain of muscle, fascia and tendon of lower back, subsequent encounter: Secondary | ICD-10-CM | POA: Diagnosis not present

## 2017-08-11 DIAGNOSIS — I10 Essential (primary) hypertension: Secondary | ICD-10-CM | POA: Diagnosis not present

## 2017-08-11 NOTE — Progress Notes (Signed)
Patient: Russell Simon Male    DOB: Jun 19, 1971   47 y.o.   MRN: [id number] Visit Date: 08/11/2017  Today's Provider: Dortha Kernennis Harli Engelken, PA   Chief Complaint  Patient presents with  . Hypertension  . Follow-up   Subjective:    HPI  Hypertension, follow-up:     BP Readings from Last 3 Encounters:  05/11/17 122/78  04/27/17 116/88  04/06/17 128/86   Hewas last seen for hypertension months ago.  BP at that visit was 122/78. Management changes since that visit include continue HCTZ 25 mg daily. Hereports goodcompliance with treatment . Heis nothaving side effects.  He has been checking BP at home. Readings have been elevated due to back pain.              Weight trend: increased a bit     Wt Readings from Last 3 Encounters:  04/27/17 169 lb (76.7 kg)  04/06/17 172 lb 3.2 oz (78.1 kg)  04/03/17 172 lb 3.2 oz (78.1 kg)   ------------------------------------------------------------------------ History reviewed. No pertinent past medical history. Patient Active Problem List   Diagnosis Date Noted  . Essential hypertension 04/03/2017  . Low back pain 03/04/2015   Past Surgical History:  Procedure Laterality Date  . TONSILLECTOMY AND ADENOIDECTOMY  1983   Family History  Problem Relation Age of Onset  . Aneurysm Maternal Grandmother   . Diabetes Maternal Grandfather   . CVA Paternal Grandmother   . Lung cancer Paternal Grandfather   . Hypertension Mother   . Hypertension Father   . Urolithiasis Father    No Known Allergies  Current Outpatient Medications:  .  hydrochlorothiazide (HYDRODIURIL) 25 MG tablet, Take 1 tablet (25 mg total) by mouth daily., Disp: 90 tablet, Rfl: 3 .  triamcinolone cream (KENALOG) 0.1 %, Apply 1 application topically 2 (two) times daily., Disp: 30 g, Rfl: 0 .  cyclobenzaprine (FLEXERIL) 5 MG tablet, Take 1-2 tablets (5-10 mg total) by mouth 3 (three) times daily as needed for muscle spasms. (Patient not taking:  Reported on 08/11/2017), Disp: 40 tablet, Rfl: 0  Review of Systems  Constitutional: Negative.   Respiratory: Negative.   Cardiovascular: Negative.   Musculoskeletal: Positive for back pain.    Social History   Tobacco Use  . Smoking status: Never Smoker  . Smokeless tobacco: Never Used  Substance Use Topics  . Alcohol use: No    Alcohol/week: 0.0 oz   Objective:   BP 116/72 (BP Location: Right Arm, Patient Position: Sitting, Cuff Size: Normal)   Pulse 69   Temp 98.5 F (36.9 C) (Oral)   Wt 174 lb (78.9 kg)   SpO2 99%   BMI 27.25 kg/m    Physical Exam  Constitutional: He is oriented to person, place, and time. He appears well-developed and well-nourished. No distress.  HENT:  Head: Normocephalic and atraumatic.  Right Ear: Hearing normal.  Left Ear: Hearing normal.  Nose: Nose normal.  Eyes: Conjunctivae and lids are normal. Right eye exhibits no discharge. Left eye exhibits no discharge. No scleral icterus.  Cardiovascular: Normal rate and regular rhythm.  Pulmonary/Chest: Effort normal. No respiratory distress.  Abdominal: Soft.  Musculoskeletal: Normal range of motion.  Neurological: He is alert and oriented to person, place, and time.  Skin: Skin is intact. No lesion and no rash noted.  Psychiatric: He has a normal mood and affect. His speech is normal and behavior is normal. Thought content normal.      Assessment & Plan:  1. Essential hypertension BP much better controlled. Continues the HCTZ 25 mg qd without side effects. No chest discomfort, muscle cramps or palpitations. Recheck BP in 3 months.  2. Strain of lumbar region, subsequent encounter Back pain much improved and has not needed the Flexeril the past couple days. Recommend rehab exercises and stretching. May apply Aspercreme with Lidocaine prn with moist heat if ache returns. Recheck prn.       Dortha Kern, PA  [facility]

## 2017-08-11 NOTE — Patient Instructions (Signed)
Back Pain, Adult Many adults have back pain from time to time. Common causes of back pain include:  A strained muscle or ligament.  Wear and tear (degeneration) of the spinal disks.  Arthritis.  A hit to the back.  Back pain can be short-lived (acute) or last a long time (chronic). A physical exam, lab tests, and imaging studies may be done to find the cause of your pain. Follow these instructions at home: Managing pain and stiffness  Take over-the-counter and prescription medicines only as told by your health care provider.  If directed, apply heat to the affected area as often as told by your health care provider. Use the heat source that your health care provider recommends, such as a moist heat pack or a heating pad. ? Place a towel between your skin and the heat source. ? Leave the heat on for 20-30 minutes. ? Remove the heat if your skin turns bright red. This is especially important if you are unable to feel pain, heat, or cold. You have a greater risk of getting burned.  If directed, apply ice to the injured area: ? Put ice in a plastic bag. ? Place a towel between your skin and the bag. ? Leave the ice on for 20 minutes, 2-3 times a day for the first 2-3 days. Activity  Do not stay in bed. Resting more than 1-2 days can delay your recovery.  Take short walks on even surfaces as soon as you are able. Try to increase the length of time you walk each day.  Do not sit, drive, or stand in one place for more than 30 minutes at a time. Sitting or standing for long periods of time can put stress on your back.  Use proper lifting techniques. When you bend and lift, use positions that put less stress on your back: ? Bend your knees. ? Keep the load close to your body. ? Avoid twisting.  Exercise regularly as told by your health care provider. Exercising will help your back heal faster. This also helps prevent back injuries by keeping muscles strong and flexible.  Your health  care provider may recommend that you see a physical therapist. This person can help you come up with a safe exercise program. Do any exercises as told by your physical therapist. Lifestyle  Maintain a healthy weight. Extra weight puts stress on your back and makes it difficult to have good posture.  Avoid activities or situations that make you feel anxious or stressed. Learn ways to manage anxiety and stress. One way to manage stress is through exercise. Stress and anxiety increase muscle tension and can make back pain worse. General instructions  Sleep on a firm mattress in a comfortable position. Try lying on your side with your knees slightly bent. If you lie on your back, put a pillow under your knees.  Follow your treatment plan as told by your health care provider. This may include: ? Cognitive or behavioral therapy. ? Acupuncture or massage therapy. ? Meditation or yoga. Contact a health care provider if:  You have pain that is not relieved with rest or medicine.  You have increasing pain going down into your legs or buttocks.  Your pain does not improve in 2 weeks.  You have pain at night.  You lose weight.  You have a fever or chills. Get help right away if:  You develop new bowel or bladder control problems.  You have unusual weakness or numbness in your arms   or legs.  You develop nausea or vomiting.  You develop abdominal pain.  You feel faint. Summary  Many adults have back pain from time to time. A physical exam, lab tests, and imaging studies may be done to find the cause of your pain.  Use proper lifting techniques. When you bend and lift, use positions that put less stress on your back.  Take over-the-counter and prescription medicines and apply heat or ice as directed by your health care provider. This information is not intended to replace advice given to you by your health care provider. Make sure you discuss any questions you have with your health care  provider. Document Released: 07/11/2005 Document Revised: 08/15/2016 Document Reviewed: 08/15/2016 Elsevier Interactive Patient Education  2018 Else[employer]  Back Exercises If you have pain in your back, do these exercises 2-3 times each day or as told by your doctor. When the pain goes away, do the exercises once each day, but repeat the steps more times for each exercise (do more repetitions). If you do not have pain in your back, do these exercises once each day or as told by your doctor. Exercises Single Knee to Chest  Do these steps 3-5 times in a row for each leg: 1. Lie on your back on a firm bed or the floor with your legs stretched out. 2. Bring one knee to your chest. 3. Hold your knee to your chest by grabbing your knee or thigh. 4. Pull on your knee until you feel a gentle stretch in your lower back. 5. Keep doing the stretch for 10-30 seconds. 6. Slowly let go of your leg and straighten it.  Pelvic Tilt  Do these steps 5-10 times in a row: 1. Lie on your back on a firm bed or the floor with your legs stretched out. 2. Bend your knees so they point up to the ceiling. Your feet should be flat on the floor. 3. Tighten your lower belly (abdomen) muscles to press your lower back against the floor. This will make your tailbone point up to the ceiling instead of pointing down to your feet or the floor. 4. Stay in this position for 5-10 seconds while you gently tighten your muscles and breathe evenly.  Cat-Cow  Do these steps until your lower back bends more easily: 1. Get on your hands and knees on a firm surface. Keep your hands under your shoulders, and keep your knees under your hips. You may put padding under your knees. 2. Let your head hang down, and make your tailbone point down to the floor so your lower back is round like the back of a cat. 3. Stay in this position for 5 seconds. 4. Slowly lift your head and make your tailbone point up to the ceiling so your back hangs  low (sags) like the back of a cow. 5. Stay in this position for 5 seconds.  Press-Ups  Do these steps 5-10 times in a row: 1. Lie on your belly (face-down) on the floor. 2. Place your hands near your head, about shoulder-width apart. 3. While you keep your back relaxed and keep your hips on the floor, slowly straighten your arms to raise the top half of your body and lift your shoulders. Do not use your back muscles. To make yourself more comfortable, you may change where you place your hands. 4. Stay in this position for 5 seconds. 5. Slowly return to lying flat on the floor.  Bridges  Do these steps   10 times in a row: 1. Lie on your back on a firm surface. 2. Bend your knees so they point up to the ceiling. Your feet should be flat on the floor. 3. Tighten your butt muscles and lift your butt off of the floor until your waist is almost as high as your knees. If you do not feel the muscles working in your butt and the back of your thighs, slide your feet 1-2 inches farther away from your butt. 4. Stay in this position for 3-5 seconds. 5. Slowly lower your butt to the floor, and let your butt muscles relax.  If this exercise is too easy, try doing it with your arms crossed over your chest. Belly Crunches  Do these steps 5-10 times in a row: 1. Lie on your back on a firm bed or the floor with your legs stretched out. 2. Bend your knees so they point up to the ceiling. Your feet should be flat on the floor. 3. Cross your arms over your chest. 4. Tip your chin a little bit toward your chest but do not bend your neck. 5. Tighten your belly muscles and slowly raise your chest just enough to lift your shoulder blades a tiny bit off of the floor. 6. Slowly lower your chest and your head to the floor.  Back Lifts Do these steps 5-10 times in a row: 1. Lie on your belly (face-down) with your arms at your sides, and rest your forehead on the floor. 2. Tighten the muscles in your legs and  your butt. 3. Slowly lift your chest off of the floor while you keep your hips on the floor. Keep the back of your head in line with the curve in your back. Look at the floor while you do this. 4. Stay in this position for 3-5 seconds. 5. Slowly lower your chest and your face to the floor.  Contact a doctor if:  Your back pain gets a lot worse when you do an exercise.  Your back pain does not lessen 2 hours after you exercise. If you have any of these problems, stop doing the exercises. Do not do them again unless your doctor says it is okay. Get help right away if:  You have sudden, very bad back pain. If this happens, stop doing the exercises. Do not do them again unless your doctor says it is okay. This information is not intended to replace advice given to you by your health care provider. Make sure you discuss any questions you have with your health care provider. Document Released: 08/13/2010 Document Revised: 12/17/2015 Document Reviewed: 09/04/2014 Elsevier Interactive Patient Education  2018 Elsevier Inc.  

## 2017-08-14 ENCOUNTER — Ambulatory Visit: Payer: Self-pay | Admitting: Family Medicine

## 2017-10-13 ENCOUNTER — Encounter: Payer: Self-pay | Admitting: Physician Assistant

## 2017-10-13 ENCOUNTER — Ambulatory Visit: Payer: BLUE CROSS/BLUE SHIELD | Admitting: Physician Assistant

## 2017-10-13 VITALS — BP 126/82 | HR 120 | Temp 100.3°F | Resp 20 | Wt 171.0 lb

## 2017-10-13 DIAGNOSIS — J101 Influenza due to other identified influenza virus with other respiratory manifestations: Secondary | ICD-10-CM | POA: Diagnosis not present

## 2017-10-13 MED ORDER — OSELTAMIVIR PHOSPHATE 75 MG PO CAPS: 75.0000 mg | ORAL_CAPSULE | Freq: Two times a day (BID) | ORAL | 0 refills | Status: AC

## 2017-10-13 NOTE — Patient Instructions (Signed)

## 2017-10-13 NOTE — Progress Notes (Signed)
[facility]  Chief Complaint  Patient presents with  . URI    Started yesterday    Subjective:    Patient ID: Russell Simon, male    DOB: 1970/08/31, 47 y.o.   MRN: [id number]  Upper Respiratory Infection: Russell Simon is a 47 y.o. male complaining of symptoms of a URI. Symptoms include congestion, cough and fever. Onset of symptoms was 1 day ago, gradually worsening since that time. He also c/o achiness, cough described as productive, low grade fever, nasal congestion and shortness of breath for the past 1 day .  He is drinking plenty of fluids. Evaluation to date: none. Treatment to date: cough suppressants and decongestants. The treatment has provided minimal.   Review of Systems  Constitutional: Positive for chills, fatigue and fever. Negative for activity change, appetite change, diaphoresis and unexpected weight change.  HENT: Positive for congestion, postnasal drip, rhinorrhea, sinus pressure, sinus pain and voice change. Negative for ear discharge, ear pain, sore throat, tinnitus and trouble swallowing.   Eyes: Positive for discharge.  Respiratory: Positive for cough, chest tightness and shortness of breath. Negative for apnea, choking, wheezing and stridor.   Gastrointestinal: Negative.        Objective:   BP 126/82 (BP Location: Right Arm, Patient Position: Sitting, Cuff Size: Normal)   Pulse (!) 120   Temp 100.3 F (37.9 C) (Oral)   Resp 20   Wt 171 lb (77.6 kg)   SpO2 98%   BMI 26.78 kg/m   Patient Active Problem List   Diagnosis Date Noted  . Essential hypertension 04/03/2017  . Low back pain 03/04/2015    Outpatient Encounter Medications as of 10/13/2017  Medication Sig Note  . cyclobenzaprine (FLEXERIL) 5 MG tablet Take 1-2 tablets (5-10 mg total) by mouth 3 (three) times daily as needed for muscle spasms. 04/27/2017: PRN  . hydrochlorothiazide (HYDRODIURIL) 25 MG tablet Take 1 tablet (25 mg total) by mouth  daily.   Marland Kitchen oseltamivir (TAMIFLU) 75 MG capsule Take 1 capsule (75 mg total) by mouth 2 (two) times daily for 5 days.   Marland Kitchen triamcinolone cream (KENALOG) 0.1 % Apply 1 application topically 2 (two) times daily.    No facility-administered encounter medications on file as of 10/13/2017.     No Known Allergies     Physical Exam  Constitutional: He is oriented to person, place, and time. He appears well-developed and well-nourished.  HENT:  Right Ear: External ear normal.  Left Ear: External ear normal.  Mouth/Throat: Posterior oropharyngeal erythema present. No oropharyngeal exudate.  Eyes: Right conjunctiva is injected. Left conjunctiva is injected.  Neck: Neck supple.  Cardiovascular: Normal rate and regular rhythm.  Pulmonary/Chest: Effort normal and breath sounds normal.  Lymphadenopathy:    He has cervical adenopathy.  Neurological: He is alert and oriented to person, place, and time.  Skin: Skin is warm and dry.  Psychiatric: He has a normal mood and affect. His behavior is normal.       Assessment & Plan:  1. Influenza A  Counseled on duration, course of symptoms and return precautions.   - oseltamivir (TAMIFLU) 75 MG capsule; Take 1 capsule (75 mg total) by mouth 2 (two) times daily for 5 days.  Dispense: 10 capsule; Refill: 0  Return if symptoms worsen or fail to improve.  The entirety of the information documented in the History of Present Illness, Review of Systems and Physical Exam were personally obtained by me. Portions of this information  were initially documented by Kavin LeechLaura Walsh, CMA and reviewed by me for thoroughness and accuracy.

## 2017-10-16 ENCOUNTER — Telehealth: Payer: Self-pay | Admitting: Family Medicine

## 2017-10-16 ENCOUNTER — Other Ambulatory Visit: Payer: Self-pay | Admitting: Family Medicine

## 2017-10-16 DIAGNOSIS — J101 Influenza due to other identified influenza virus with other respiratory manifestations: Secondary | ICD-10-CM

## 2017-10-16 MED ORDER — HYDROCODONE-HOMATROPINE 5-1.5 MG/5ML PO SYRP: 5.0000 mL | ORAL_SOLUTION | Freq: Three times a day (TID) | ORAL | 0 refills | Status: DC | PRN

## 2017-10-16 NOTE — Telephone Encounter (Signed)
Will send cough syrup to the pharmacy. Should recheck lungs if any fever or cough with sputum production tomorrow. Continue flu medication (Tamiflu).

## 2017-10-16 NOTE — Telephone Encounter (Signed)
Patient advised.

## 2017-10-16 NOTE — Telephone Encounter (Signed)
Pt states he was seen Saturday and dxed with the Flu states his cough has progressively getting worse to the point he is vomiting when he coughs.  He is requesting something be called into [facility] on [address] for the cough.

## 2017-12-19 ENCOUNTER — Emergency Department: Payer: Worker's Compensation

## 2017-12-19 ENCOUNTER — Encounter: Payer: Self-pay | Admitting: Emergency Medicine

## 2017-12-19 ENCOUNTER — Emergency Department
Admission: EM | Admit: 2017-12-19 | Discharge: 2017-12-19 | Disposition: A | Discharge status: Home / Self Care | Payer: Worker's Compensation | Attending: Emergency Medicine | Admitting: Emergency Medicine

## 2017-12-19 ENCOUNTER — Other Ambulatory Visit: Payer: Self-pay

## 2017-12-19 DIAGNOSIS — W208XXA Other cause of strike by thrown, projected or falling object, initial encounter: Secondary | ICD-10-CM | POA: Diagnosis not present

## 2017-12-19 DIAGNOSIS — Z79899 Other long term (current) drug therapy: Secondary | ICD-10-CM | POA: Insufficient documentation

## 2017-12-19 DIAGNOSIS — Y990 Civilian activity done for income or pay: Secondary | ICD-10-CM | POA: Diagnosis not present

## 2017-12-19 DIAGNOSIS — S8002XA Contusion of left knee, initial encounter: Secondary | ICD-10-CM

## 2017-12-19 DIAGNOSIS — Y9289 Other specified places as the place of occurrence of the external cause: Secondary | ICD-10-CM | POA: Diagnosis not present

## 2017-12-19 DIAGNOSIS — Y9389 Activity, other specified: Secondary | ICD-10-CM | POA: Diagnosis not present

## 2017-12-19 DIAGNOSIS — R202 Paresthesia of skin: Secondary | ICD-10-CM

## 2017-12-19 DIAGNOSIS — M25562 Pain in left knee: Secondary | ICD-10-CM

## 2017-12-19 DIAGNOSIS — S8992XA Unspecified injury of left lower leg, initial encounter: Secondary | ICD-10-CM | POA: Diagnosis present

## 2017-12-19 DIAGNOSIS — I10 Essential (primary) hypertension: Secondary | ICD-10-CM | POA: Insufficient documentation

## 2017-12-19 MED ORDER — KETOROLAC TROMETHAMINE 60 MG/2ML IM SOLN
60.0000 mg | Freq: Once | INTRAMUSCULAR | Status: DC
  Filled 2017-12-19: qty 2

## 2017-12-19 MED ORDER — GABAPENTIN 100 MG PO CAPS: 100.0000 mg | ORAL_CAPSULE | Freq: Three times a day (TID) | ORAL | 0 refills | Status: DC
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
## 2017-12-19 NOTE — Discharge Instructions (Signed)
Please follow-up with orthopedic surgery should your pain and numbness persist.  Please return with any worsening condition any other concerns.
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
## 2018-05-20 ENCOUNTER — Other Ambulatory Visit: Payer: Self-pay | Admitting: Family Medicine

## 2018-05-20 DIAGNOSIS — I10 Essential (primary) hypertension: Secondary | ICD-10-CM

## 2018-05-22 ENCOUNTER — Other Ambulatory Visit: Payer: Self-pay

## 2018-05-22 ENCOUNTER — Ambulatory Visit: Payer: BLUE CROSS/BLUE SHIELD | Admitting: Family Medicine

## 2018-05-22 ENCOUNTER — Encounter: Payer: Self-pay | Admitting: Family Medicine

## 2018-05-22 VITALS — BP 130/78 | HR 94 | Temp 98.3°F | Ht 67.0 in | Wt 168.8 lb

## 2018-05-22 DIAGNOSIS — I10 Essential (primary) hypertension: Secondary | ICD-10-CM | POA: Diagnosis not present

## 2018-05-22 DIAGNOSIS — M545 Low back pain, unspecified: Secondary | ICD-10-CM

## 2018-05-22 MED ORDER — HYDROCHLOROTHIAZIDE 25 MG PO TABS: 25.0000 mg | ORAL_TABLET | Freq: Every day | ORAL | 3 refills | Status: DC

## 2018-05-22 MED ORDER — CYCLOBENZAPRINE HCL 5 MG PO TABS: 5.0000 mg | ORAL_TABLET | Freq: Three times a day (TID) | ORAL | 0 refills | Status: DC | PRN

## 2018-05-22 NOTE — Progress Notes (Signed)
Patient: Russell Simon Male    DOB: 05/03/1971   47 y.o.   MRN: [id number] Visit Date: 05/22/2018  Today's Provider: Dortha Kern, PA   Chief Complaint  Patient presents with  . Back Pain    lower back   Subjective:    HPI  Pt reports he is having lower back pain since Sunday 05/20/18.  Pt reports that he doesn't know what has caused the pain but has been coming and going for a while now through the years but recently in the past few days it has felt like a fire poker stuck in back and twisting the muscle in lower back on right side. Some burning numbness radiating into the right thigh. Does not go to the feet. Tried Extra Strength Tylenol and alternating heating pad with ice packs. History reviewed. No pertinent past medical history. Patient Active Problem List   Diagnosis Date Noted  . Essential hypertension 04/03/2017  . Low back pain 03/04/2015   Past Surgical History:  Procedure Laterality Date  . TONSILLECTOMY AND ADENOIDECTOMY  1983   Family History  Problem Relation Age of Onset  . Aneurysm Maternal Grandmother   . Diabetes Maternal Grandfather   . CVA Paternal Grandmother   . Lung cancer Paternal Grandfather   . Hypertension Mother   . Hypertension Father   . Urolithiasis Father    No Known Allergies  Current Outpatient Medications:  .  acetaminophen (TYLENOL) 500 MG tablet, Take 500 mg by mouth every 6 (six) hours as needed (pt takes two tablets as needed)., Disp: , Rfl:  .  hydrochlorothiazide (HYDRODIURIL) 25 MG tablet, TAKE 1 TABLET BY MOUTH EVERY DAY, Disp: 30 tablet, Rfl: 3 .  cyclobenzaprine (FLEXERIL) 5 MG tablet, Take 1-2 tablets (5-10 mg total) by mouth 3 (three) times daily as needed for muscle spasms. (Patient not taking: Reported on 05/22/2018), Disp: 40 tablet, Rfl: 0 .  gabapentin (NEURONTIN) 100 MG capsule, Take 1 capsule (100 mg total) by mouth 3 (three) times daily. (Patient not taking: Reported on 05/22/2018), Disp: 30 capsule, Rfl:  0 .  HYDROcodone-homatropine (HYCODAN) 5-1.5 MG/5ML syrup, Take 5 mLs by mouth every 8 (eight) hours as needed for cough. (Patient not taking: Reported on 05/22/2018), Disp: 120 mL, Rfl: 0 .  triamcinolone cream (KENALOG) 0.1 %, Apply 1 application topically 2 (two) times daily. (Patient not taking: Reported on 05/22/2018), Disp: 30 g, Rfl: 0  Review of Systems  Constitutional: Negative.   HENT: Negative.   Eyes: Negative.   Respiratory: Negative.   Cardiovascular: Negative.   Gastrointestinal: Negative.   Endocrine: Negative.   Genitourinary: Negative.   Musculoskeletal: Positive for back pain (lower right side). Negative for arthralgias, gait problem, joint swelling, myalgias, neck pain and neck stiffness.  Skin: Negative.   Allergic/Immunologic: Negative.   Neurological: Negative.   Hematological: Negative.   Psychiatric/Behavioral: Negative.    Social History   Tobacco Use  . Smoking status: Never Smoker  . Smokeless tobacco: Never Used  Substance Use Topics  . Alcohol use: No    Alcohol/week: 0.0 standard drinks   Objective:   BP 130/78 (BP Location: Right Arm, Patient Position: Sitting, Cuff Size: Normal)   Pulse 94   Temp 98.3 F (36.8 C) (Oral)   Ht 5\' 7"  (1.702 m)   Wt 168 lb 12.8 oz (76.6 kg)   SpO2 98%   BMI 26.44 kg/m  Vitals:   05/22/18 1521  BP: 130/78  Pulse: 94  Temp:  98.3 F (36.8 C)  TempSrc: Oral  SpO2: 98%  Weight: 168 lb 12.8 oz (76.6 kg)  Height: 5\' 7"  (1.702 m)   Physical Exam  Constitutional: He is oriented to person, place, and time. He appears well-developed and well-nourished. No distress.  HENT:  Head: Normocephalic and atraumatic.  Right Ear: Hearing normal.  Left Ear: Hearing normal.  Nose: Nose normal.  Eyes: Conjunctivae and lids are normal. Right eye exhibits no discharge. Left eye exhibits no discharge. No scleral icterus.  Neck: Neck supple.  Cardiovascular: Normal rate and regular rhythm.  Pulmonary/Chest: Effort normal  and breath sounds normal. No respiratory distress.  Abdominal: Soft. Bowel sounds are normal.  Musculoskeletal: He exhibits tenderness.  Low back tenderness on the right with some radiation to the right thigh. DTR's equal and active. Unable to test SLR's without spasm on the right.  Neurological: He is alert and oriented to person, place, and time. He displays normal reflexes.  Skin: Skin is intact. No lesion and no rash noted.  Psychiatric: He has a normal mood and affect. His speech is normal and behavior is normal. Thought content normal.      Assessment & Plan:     1. Acute right-sided low back pain, unspecified whether sciatica present Onset of right low back pain with spasms over the past 2 days. Does some repetitive lifting at work but can't remember a specific injury. Not much relief from ice packs or heating pad. Should use stretching exercises, OTC NSAID and add Flexeril for spasm. Recheck if no better in a week. - cyclobenzaprine (FLEXERIL) 5 MG tablet; Take 1-2 tablets (5-10 mg total) by mouth 3 (three) times daily as needed for muscle spasms.  Dispense: 40 tablet; Refill: 0  2. Essential hypertension Stable and well controlled. Tolerating HCTZ 25 mg qd without side effects. Will check routine labs and refill prescription. Follow up pending reports. - CBC with Differential/Platelet - Comprehensive metabolic panel - hydrochlorothiazide (HYDRODIURIL) 25 MG tablet; Take 1 tablet (25 mg total) by mouth daily.  Dispense: 90 tablet; Refill: 3       Dortha Kern, PA  [facility]

## 2018-06-07 DIAGNOSIS — I10 Essential (primary) hypertension: Secondary | ICD-10-CM | POA: Diagnosis not present

## 2018-06-08 LAB — CBC WITH DIFFERENTIAL/PLATELET
BASOS ABS: 0 10*3/uL (ref 0.0–0.2)
BASOS: 0 %
EOS (ABSOLUTE): 0.1 10*3/uL (ref 0.0–0.4)
Eos: 2 %
Hematocrit: 51 % (ref 37.5–51.0)
Hemoglobin: 17.3 g/dL (ref 13.0–17.7)
Immature Grans (Abs): 0 10*3/uL (ref 0.0–0.1)
Immature Granulocytes: 0 %
LYMPHS ABS: 1.8 10*3/uL (ref 0.7–3.1)
LYMPHS: 38 %
MCH: 28.6 pg (ref 26.6–33.0)
MCHC: 33.9 g/dL (ref 31.5–35.7)
MCV: 84 fL (ref 79–97)
MONOS ABS: 0.4 10*3/uL (ref 0.1–0.9)
Monocytes: 8 %
NEUTROS ABS: 2.4 10*3/uL (ref 1.4–7.0)
Neutrophils: 52 %
PLATELETS: 184 10*3/uL (ref 150–450)
RBC: 6.05 x10E6/uL — ABNORMAL HIGH (ref 4.14–5.80)
RDW: 14 % (ref 12.3–15.4)
WBC: 4.7 10*3/uL (ref 3.4–10.8)

## 2018-06-08 LAB — COMPREHENSIVE METABOLIC PANEL
A/G RATIO: 2 (ref 1.2–2.2)
ALK PHOS: 72 IU/L (ref 39–117)
ALT: 31 IU/L (ref 0–44)
AST: 19 IU/L (ref 0–40)
Albumin: 5 g/dL (ref 3.5–5.5)
BILIRUBIN TOTAL: 0.9 mg/dL (ref 0.0–1.2)
BUN / CREAT RATIO: 15 (ref 9–20)
BUN: 17 mg/dL (ref 6–24)
CO2: 24 mmol/L (ref 20–29)
Calcium: 10 mg/dL (ref 8.7–10.2)
Chloride: 98 mmol/L (ref 96–106)
Creatinine, Ser: 1.13 mg/dL (ref 0.76–1.27)
GFR, EST AFRICAN AMERICAN: 90 mL/min/{1.73_m2} (ref >59)
GFR, EST NON AFRICAN AMERICAN: 78 mL/min/{1.73_m2} (ref >59)
GLUCOSE: 83 mg/dL (ref 65–99)
Globulin, Total: 2.5 g/dL (ref 1.5–4.5)
POTASSIUM: 3.8 mmol/L (ref 3.5–5.2)
Sodium: 138 mmol/L (ref 134–144)
Total Protein: 7.5 g/dL (ref 6.0–8.5)

## 2018-08-03 ENCOUNTER — Emergency Department: Payer: BLUE CROSS/BLUE SHIELD

## 2018-08-03 DIAGNOSIS — Z79899 Other long term (current) drug therapy: Secondary | ICD-10-CM | POA: Diagnosis not present

## 2018-08-03 DIAGNOSIS — R079 Chest pain, unspecified: Secondary | ICD-10-CM | POA: Diagnosis not present

## 2018-08-03 DIAGNOSIS — R0789 Other chest pain: Secondary | ICD-10-CM | POA: Insufficient documentation

## 2018-08-03 DIAGNOSIS — R42 Dizziness and giddiness: Secondary | ICD-10-CM | POA: Diagnosis not present

## 2018-08-03 DIAGNOSIS — I10 Essential (primary) hypertension: Secondary | ICD-10-CM | POA: Insufficient documentation

## 2018-08-03 DIAGNOSIS — R0602 Shortness of breath: Secondary | ICD-10-CM | POA: Diagnosis not present

## 2018-08-03 LAB — CBC
HEMATOCRIT: 50 % (ref 39.0–52.0)
Hemoglobin: 17.6 g/dL — ABNORMAL HIGH (ref 13.0–17.0)
MCH: 29.9 pg (ref 26.0–34.0)
MCHC: 35.2 g/dL (ref 30.0–36.0)
MCV: 85 fL (ref 80.0–100.0)
NRBC: 0 % (ref 0.0–0.2)
PLATELETS: 207 10*3/uL (ref 150–400)
RBC: 5.88 MIL/uL — ABNORMAL HIGH (ref 4.22–5.81)
RDW: 13.2 % (ref 11.5–15.5)
WBC: 7.9 10*3/uL (ref 4.0–10.5)

## 2018-08-03 NOTE — ED Triage Notes (Signed)
Patient c/o chest pain, dizziness, malaise beginning last night.

## 2018-08-04 ENCOUNTER — Emergency Department
Admission: EM | Admit: 2018-08-04 | Discharge: 2018-08-04 | Disposition: A | Discharge status: Home / Self Care | Payer: BLUE CROSS/BLUE SHIELD | Attending: Emergency Medicine | Admitting: Emergency Medicine

## 2018-08-04 DIAGNOSIS — R0789 Other chest pain: Secondary | ICD-10-CM

## 2018-08-04 HISTORY — DX: Essential (primary) hypertension: I10

## 2018-08-04 LAB — BASIC METABOLIC PANEL
Anion gap: 10 (ref 5–15)
BUN: 23 mg/dL — ABNORMAL HIGH (ref 6–20)
CALCIUM: 10 mg/dL (ref 8.9–10.3)
CO2: 25 mmol/L (ref 22–32)
CREATININE: 1.07 mg/dL (ref 0.61–1.24)
Chloride: 105 mmol/L (ref 98–111)
GFR calc non Af Amer: >60 mL/min (ref >60)
GLUCOSE: 91 mg/dL (ref 70–99)
Potassium: 3.3 mmol/L — ABNORMAL LOW (ref 3.5–5.1)
Sodium: 140 mmol/L (ref 135–145)

## 2018-08-04 LAB — TROPONIN I
Troponin I: <0.03 ng/mL (ref <0.03)
Troponin I: <0.03 ng/mL (ref <0.03)

## 2018-08-04 MED ORDER — IBUPROFEN 600 MG PO TABS
600.0000 mg | ORAL_TABLET | Freq: Once | ORAL | Status: AC
  Administered 2018-08-04: 600 mg via ORAL
  Filled 2018-08-04: qty 1

## 2018-08-04 MED ORDER — HYDROXYZINE PAMOATE 100 MG PO CAPS: 100.0000 mg | ORAL_CAPSULE | Freq: Three times a day (TID) | ORAL | 0 refills | Status: DC | PRN

## 2018-08-04 MED ORDER — IBUPROFEN 600 MG PO TABS: 600.0000 mg | ORAL_TABLET | Freq: Three times a day (TID) | ORAL | 0 refills | Status: DC | PRN

## 2018-08-04 MED ORDER — LORAZEPAM 1 MG PO TABS
1.0000 mg | ORAL_TABLET | Freq: Once | ORAL | Status: AC
  Administered 2018-08-04: 1 mg via ORAL
  Filled 2018-08-04: qty 1

## 2018-08-04 NOTE — ED Notes (Signed)
Pt reports Thursday night he stood up, became dizzy, felt some SOB and had central chest tightness, lasting several hours.  Had dizziness and SOB through the day Friday. Friday night onset of chest tightness again, about a 6/10, currently pain is about 4/10. Denies recent cough or sickness. No meds PTA.

## 2018-08-04 NOTE — ED Provider Notes (Signed)
[facility] Emergency Department Provider Note  ____________________________________________   First MD Initiated Contact with Patient 08/04/18 [phone]     (approximate)  I have reviewed the triage vital signs and the nursing notes.   HISTORY  Chief Complaint Chest Pain   HPI Russell Simon is a 48 y.o. male who comes to the emergency department with atypical chest pain that began roughly 48 hours ago.  He stood up became lightheaded had some mild shortness of breath and central chest aching discomfort that lasted several hours.  It was nonexertional.  Did not radiate.  Was somewhat worse with deep inspiration.  Yesterday he had intermittent lightheadedness and shortness of breath throughout the day and his pain never fully went away which prompted the visit today.  Pain is not positional.  It is not ripping or tearing does not go straight to his back.  He denies abdominal pain nausea or vomiting.  No cough.  No history of coronary artery disease or stroke.  Denies cocaine use.   His symptoms were initially intermittent and are now constant and nothing in particular seems to make them better or worse.   Past Medical History:  Diagnosis Date  . Hypertension     Patient Active Problem List   Diagnosis Date Noted  . Essential hypertension 04/03/2017  . Low back pain 03/04/2015    Past Surgical History:  Procedure Laterality Date  . TONSILLECTOMY AND ADENOIDECTOMY  1983    Prior to Admission medications   Medication Sig Start Date End Date Taking? Authorizing Provider  acetaminophen (TYLENOL) 500 MG tablet Take 500 mg by mouth every 6 (six) hours as needed (pt takes two tablets as needed).    [provider]  cyclobenzaprine (FLEXERIL) 5 MG tablet Take 1-2 tablets (5-10 mg total) by mouth 3 (three) times daily as needed for muscle spasms. 05/22/18   Chrismon, Jodell Cipro, PA  gabapentin (NEURONTIN) 100 MG capsule Take 1 capsule (100 mg total) by  mouth 3 (three) times daily. Patient not taking: Reported on 05/22/2018 12/19/17 12/19/18  Rebecka Apley, MD  hydrochlorothiazide (HYDRODIURIL) 25 MG tablet Take 1 tablet (25 mg total) by mouth daily. 05/22/18   Chrismon, Jodell Cipro, PA  HYDROcodone-homatropine (HYCODAN) 5-1.5 MG/5ML syrup Take 5 mLs by mouth every 8 (eight) hours as needed for cough. Patient not taking: Reported on 05/22/2018 10/16/17   Chrismon, Jodell Cipro, PA  hydrOXYzine (VISTARIL) 100 MG capsule Take 1 capsule (100 mg total) by mouth 3 (three) times daily as needed for anxiety. 08/04/18   Merrily Brittle, MD  ibuprofen (ADVIL,MOTRIN) 600 MG tablet Take 1 tablet (600 mg total) by mouth every 8 (eight) hours as needed. 08/04/18   Merrily Brittle, MD  triamcinolone cream (KENALOG) 0.1 % Apply 1 application topically 2 (two) times daily. Patient not taking: Reported on 05/22/2018 02/16/17   Anola Gurney, PA    Allergies Patient has no known allergies.  Family History  Problem Relation Age of Onset  . Aneurysm Maternal Grandmother   . Diabetes Maternal Grandfather   . CVA Paternal Grandmother   . Lung cancer Paternal Grandfather   . Hypertension Mother   . Hypertension Father   . Urolithiasis Father     Social History Social History   Tobacco Use  . Smoking status: Never Smoker  . Smokeless tobacco: Never Used  Substance Use Topics  . Alcohol use: No    Alcohol/week: 0.0 standard drinks  . Drug use: No    Review  of Systems Constitutional: No fever/chills Eyes: No visual changes. ENT: No sore throat. Cardiovascular: Positive for chest pain. Respiratory: Positive for shortness of breath. Gastrointestinal: No abdominal pain.  No nausea, no vomiting.  No diarrhea.  No constipation. Genitourinary: Negative for dysuria. Musculoskeletal: Negative for back pain. Skin: Negative for rash. Neurological: Negative for headaches, focal weakness or  numbness.   ____________________________________________   PHYSICAL EXAM:  VITAL SIGNS: ED Triage Vitals  Enc Vitals Group     BP 08/03/18 2334 (!) 147/101     Pulse Rate 08/03/18 2334 97     Resp 08/03/18 2334 18     Temp 08/03/18 2334 (!) 97.5 F (36.4 C)     Temp Source 08/03/18 2334 Oral     SpO2 08/03/18 2334 100 %     Weight 08/03/18 2333 170 lb (77.1 kg)     Height 08/03/18 2333 5\' 7"  (1.702 m)     Head Circumference --      Peak Flow --      Pain Score 08/03/18 2332 6     Pain Loc --      Pain Edu? --      Excl. in GC? --     Constitutional: Alert and oriented x4 quite anxious appearing wringing his hands although nontoxic no diaphoresis Eyes: PERRL EOMI. midrange and brisk Head: Atraumatic. Nose: No congestion/rhinnorhea. Mouth/Throat: No trismus Neck: No stridor.   Cardiovascular: Normal rate, regular rhythm. Grossly normal heart sounds.  Good peripheral circulation. Respiratory: Normal respiratory effort.  No retractions. Lungs CTAB and moving good air Gastrointestinal: Soft nontender Musculoskeletal: No lower extremity edema legs are equal in size Neurologic:  Normal speech and language. No gross focal neurologic deficits are appreciated. Skin:  Skin is warm, dry and intact. No rash noted. Psychiatric: Mood and affect are normal. Speech and behavior are normal.    ____________________________________________   DIFFERENTIAL includes but not limited to  Acute coronary syndrome, vasospasm, anxiety, pulmonary embolism, aortic dissection, cholelithiasis ____________________________________________   LABS (all labs ordered are listed, but only abnormal results are displayed)  Labs Reviewed  BASIC METABOLIC PANEL - Abnormal; Notable for the following components:      Result Value   Potassium 3.3 (*)    BUN 23 (*)    All other components within normal limits  CBC - Abnormal; Notable for the following components:   RBC 5.88 (*)    Hemoglobin 17.6 (*)     All other components within normal limits  TROPONIN I  TROPONIN I    Lab work reviewed by me with elevated hemoglobin consistent with dehydration.  No signs of acute ischemia x2 __________________________________________  EKG  ED ECG REPORT I, Merrily Brittle, the attending physician, personally viewed and interpreted this ECG.  Date: 08/09/2018 EKG Time:  Rate: 94 Rhythm: normal sinus rhythm QRS Axis: normal Intervals: normal ST/T Wave abnormalities: normal Narrative Interpretation: no evidence of acute ischemia  ____________________________________________  RADIOLOGY  Chest x-ray reviewed by me with no acute disease ____________________________________________   PROCEDURES  Procedure(s) performed: no  Procedures  Critical Care performed: no  ____________________________________________   INITIAL IMPRESSION / ASSESSMENT AND PLAN / ED COURSE  Pertinent labs & imaging results that were available during my care of the patient were reviewed by me and considered in my medical decision making (see chart for details).   As part of my medical decision making, I reviewed the following data within the electronic MEDICAL RECORD NUMBER History obtained from family if available, nursing notes,  old chart and ekg, as well as notes from prior ED visits.  Patient comes to the emergency department quite nervous appearing with roughly 48 hours of stuttering chest pain.  His EKG is nonischemic and first troponin is negative.  Given ibuprofen and Ativan with improvement in symptoms.  Given his age and previous lack of cardiac for stratification I did obtain a second troponin which fortunately is negative.  He has no abdominal symptoms and doubt cholelithiasis etc.  Doubt pulmonary embolism or aortic dissection.  Will discharge the patient home with hydroxyzine for his anxiety along with ibuprofen and primary care follow-up.  He verbalizes understanding agreement the plan with strict return  precautions given.      ____________________________________________   FINAL CLINICAL IMPRESSION(S) / ED DIAGNOSES  Final diagnoses:  Atypical chest pain      NEW MEDICATIONS STARTED DURING THIS VISIT:  Discharge Medication List as of 08/04/2018  3:26 AM    START taking these medications   Details  hydrOXYzine (VISTARIL) 100 MG capsule Take 1 capsule (100 mg total) by mouth 3 (three) times daily as needed for anxiety., Starting Sat 08/04/2018, Print    ibuprofen (ADVIL,MOTRIN) 600 MG tablet Take 1 tablet (600 mg total) by mouth every 8 (eight) hours as needed., Starting Sat 08/04/2018, Print         Note:  This document was prepared using Dragon voice recognition software and may include unintentional dictation errors.    Merrily Brittleifenbark, Kanchan Gal, MD 08/09/18 [phone]

## 2018-08-04 NOTE — Discharge Instructions (Signed)
Fortunately today your lab work, your chest XR, and your EKG were very reassuring.  Please take your pain medication as needed for symptomatic control and follow up with your PMD within 1 week for a recheck.  Return to the ED for any concerns.  It was a pleasure to take care of you today, and thank you for coming to our emergency department.  If you have any questions or concerns before leaving please ask the nurse to grab me and I'm more than happy to go through your aftercare instructions again.  If you were prescribed any opioid pain medication today such as Norco, Vicodin, Percocet, morphine, hydrocodone, or oxycodone please make sure you do not drive when you are taking this medication as it can alter your ability to drive safely.  If you have any concerns once you are home that you are not improving or are in fact getting worse before you can make it to your follow-up appointment, please do not hesitate to call 911 and come back for further evaluation.  Merrily Brittle, MD  Results for orders placed or performed during the hospital encounter of 08/04/18  Basic metabolic panel  Result Value Ref Range   Sodium 140 135 - 145 mmol/L   Potassium 3.3 (L) 3.5 - 5.1 mmol/L   Chloride 105 98 - 111 mmol/L   CO2 25 22 - 32 mmol/L   Glucose, Bld 91 70 - 99 mg/dL   BUN 23 (H) 6 - 20 mg/dL   Creatinine, Ser 9.16 0.61 - 1.24 mg/dL   Calcium 38.4 8.9 - 66.5 mg/dL   GFR calc non Af Amer >60 >60 mL/min   GFR calc Af Amer >60 >60 mL/min   Anion gap 10 5 - 15  CBC  Result Value Ref Range   WBC 7.9 4.0 - 10.5 K/uL   RBC 5.88 (H) 4.22 - 5.81 MIL/uL   Hemoglobin 17.6 (H) 13.0 - 17.0 g/dL   HCT 99.3 57.0 - 17.7 %   MCV 85.0 80.0 - 100.0 fL   MCH 29.9 26.0 - 34.0 pg   MCHC 35.2 30.0 - 36.0 g/dL   RDW 93.9 03.0 - 09.2 %   Platelets 207 150 - 400 K/uL   nRBC 0.0 0.0 - 0.2 %  Troponin I - ONCE - STAT  Result Value Ref Range   Troponin I <0.03 <0.03 ng/mL  Troponin I - Once  Result Value Ref Range     Troponin I <0.03 <0.03 ng/mL   Dg Chest 2 View  Result Date: 08/04/2018 CLINICAL DATA:  Chest pain EXAM: CHEST - 2 VIEW COMPARISON:  None. FINDINGS: The heart size and mediastinal contours are within normal limits. Both lungs are clear. Mild degenerative changes of the spine. IMPRESSION: No active cardiopulmonary disease. Electronically Signed   By: Jasmine Pang M.D.   On: 08/04/2018 00:13

## 2018-09-17 NOTE — Progress Notes (Addendum)
Patient: Russell Simon Male    DOB: Mar 19, 1971   48 y.o.   MRN: [id number] Visit Date: 09/18/2018  Today's Provider: Dortha Kern, PA   Chief Complaint  Patient presents with  . Anxiety   Subjective:     Anxiety  Presents for initial visit. Onset was 1 to 6 months ago. The problem has been gradually worsening. Symptoms include decreased concentration and dizziness. Patient reports no chest pain, insomnia, nausea, palpitations or shortness of breath. Symptoms occur occasionally. The severity of symptoms is interfering with daily activities and moderate. Nothing aggravates the symptoms. The quality of sleep is fair. Nighttime awakenings: occasional.    patient has been having anxiety intermittently for about 1 month. Patient was seen in [facility] ED 08/04/2018. Patient was given rx for hydroxyzine. Patient states episodes of anxiety are still occurring. Episodes can last for hours with chest tightness and shortness of breath.   Past Medical History:  Diagnosis Date  . Hypertension    Past Surgical History:  Procedure Laterality Date  . TONSILLECTOMY AND ADENOIDECTOMY  1983   Family History  Problem Relation Age of Onset  . Aneurysm Maternal Grandmother   . Diabetes Maternal Grandfather   . CVA Paternal Grandmother   . Lung cancer Paternal Grandfather   . Hypertension Mother   . Hypertension Father   . Urolithiasis Father    No Known Allergies  Current Outpatient Medications:  .  hydrochlorothiazide (HYDRODIURIL) 25 MG tablet, Take 1 tablet (25 mg total) by mouth daily., Disp: 90 tablet, Rfl: 3 .  hydrOXYzine (VISTARIL) 100 MG capsule, Take 1 capsule (100 mg total) by mouth 3 (three) times daily as needed for anxiety., Disp: 30 capsule, Rfl: 0 .  ibuprofen (ADVIL,MOTRIN) 600 MG tablet, Take 1 tablet (600 mg total) by mouth every 8 (eight) hours as needed., Disp: 30 tablet, Rfl: 0 .  acetaminophen (TYLENOL) 500 MG tablet, Take 500 mg by mouth every 6 (six) hours as  needed (pt takes two tablets as needed)., Disp: , Rfl:   Review of Systems  Constitutional: Negative for appetite change, chills and fever.  Respiratory: Positive for chest tightness. Negative for shortness of breath and wheezing.   Cardiovascular: Negative for chest pain and palpitations.  Gastrointestinal: Negative for abdominal pain, nausea and vomiting.  Neurological: Positive for dizziness.  Psychiatric/Behavioral: Positive for decreased concentration. The patient does not have insomnia.    Social History   Tobacco Use  . Smoking status: Never Smoker  . Smokeless tobacco: Never Used  Substance Use Topics  . Alcohol use: No    Alcohol/week: 0.0 standard drinks     Objective:   BP 138/84 (BP Location: Right Arm, Patient Position: Sitting, Cuff Size: Large)   Pulse 83   Temp 98.1 F (36.7 C) (Oral)   Wt 177 lb 3.2 oz (80.4 kg)   SpO2 99%   BMI 27.75 kg/m  Vitals:   09/18/18 1020  BP: 138/84  Pulse: 83  Temp: 98.1 F (36.7 C)  TempSrc: Oral  SpO2: 99%  Weight: 177 lb 3.2 oz (80.4 kg)   Physical Exam Constitutional:      General: He is not in acute distress.    Appearance: He is well-developed.  HENT:     Head: Normocephalic and atraumatic.     Right Ear: Hearing and tympanic membrane normal.     Left Ear: Hearing and tympanic membrane normal.     Nose: Nose normal.     Mouth/Throat:  Pharynx: Oropharynx is clear.  Eyes:     General: Lids are normal. No scleral icterus.       Right eye: No discharge.        Left eye: No discharge.     Conjunctiva/sclera: Conjunctivae normal.  Cardiovascular:     Rate and Rhythm: Normal rate and regular rhythm.     Heart sounds: Normal heart sounds.  Pulmonary:     Effort: Pulmonary effort is normal. No respiratory distress.     Breath sounds: Normal breath sounds.  Abdominal:     General: Bowel sounds are normal.     Palpations: Abdomen is soft.  Musculoskeletal: Normal range of motion.  Skin:    Findings: No  lesion or rash.  Neurological:     Mental Status: He is alert and oriented to person, place, and time.  Psychiatric:        Attention and Perception: Attention normal.        Mood and Affect: Mood is anxious.        Speech: Speech normal.        Behavior: Behavior normal.        Thought Content: Thought content normal.       Assessment & Plan    1. Generalized anxiety disorder Having episodes of dizziness, chest tightness, slight confusion and crying spell intermittently over the past month. Last episode occurred at work (2:00 am on 09-17-18) and improved after taking Hydroxyzine and sleeping. Had similar episode 08-04-18 when he went to the ER. EKG and troponin level was normal at that time. Will add SSRI daily to the prn use of Hydroxyzine given in the ER. Recheck labs and follow up pending reports. - CBC with Differential/Platelet - Comprehensive metabolic panel - TSH - hydrOXYzine (VISTARIL) 25 MG capsule; Take 1 capsule by mouth 3 (three) times daily as needed for anxiety.  Dispense: 30 capsule; Refill: 0 - sertraline (ZOLOFT) 50 MG tablet; Take 1 tablet (50 mg total) by mouth daily.  Dispense: 30 tablet; Refill: 3  2. Dizziness Feels this is not a true vertigo but felt light headed with episodes associated with anxiety. Will check labs to rule out metabolic disorder or electrolyte imbalance. - CBC with Differential/Platelet - Comprehensive metabolic panel - TSH  3. Essential hypertension Well controlled and tolerating the HCTZ without muscle cramps. Had slight drop in potassium on 08-04-18 in the ER. Will recheck labs and follow up pending reports. - CBC with Differential/Platelet - Comprehensive metabolic panel - TSH     Dortha Kern, PA  [facility]

## 2018-09-18 ENCOUNTER — Ambulatory Visit: Payer: BLUE CROSS/BLUE SHIELD | Admitting: Family Medicine

## 2018-09-18 ENCOUNTER — Encounter: Payer: Self-pay | Admitting: Family Medicine

## 2018-09-18 VITALS — BP 138/84 | HR 83 | Temp 98.1°F | Wt 177.2 lb

## 2018-09-18 DIAGNOSIS — R42 Dizziness and giddiness: Secondary | ICD-10-CM

## 2018-09-18 DIAGNOSIS — I10 Essential (primary) hypertension: Secondary | ICD-10-CM

## 2018-09-18 DIAGNOSIS — F411 Generalized anxiety disorder: Secondary | ICD-10-CM | POA: Diagnosis not present

## 2018-09-18 MED ORDER — HYDROXYZINE PAMOATE 100 MG PO CAPS: 100.0000 mg | ORAL_CAPSULE | Freq: Three times a day (TID) | ORAL | 0 refills | Status: DC | PRN

## 2018-09-18 MED ORDER — HYDROXYZINE PAMOATE 25 MG PO CAPS: 25.0000 mg | ORAL_CAPSULE | Freq: Three times a day (TID) | ORAL | 0 refills | Status: DC | PRN

## 2018-09-18 MED ORDER — SERTRALINE HCL 50 MG PO TABS: 50.0000 mg | ORAL_TABLET | Freq: Every day | ORAL | 3 refills | Status: DC

## 2018-09-18 NOTE — Addendum Note (Signed)
Addended by: Dortha Kern E on: 09/18/2018 12:24 PM   Modules accepted: Orders

## 2018-09-19 LAB — CBC WITH DIFFERENTIAL/PLATELET
BASOS: 1 %
Basophils Absolute: 0 10*3/uL (ref 0.0–0.2)
EOS (ABSOLUTE): 0.2 10*3/uL (ref 0.0–0.4)
EOS: 3 %
HEMATOCRIT: 47.3 % (ref 37.5–51.0)
HEMOGLOBIN: 16.4 g/dL (ref 13.0–17.7)
IMMATURE GRANS (ABS): 0 10*3/uL (ref 0.0–0.1)
IMMATURE GRANULOCYTES: 0 %
LYMPHS: 38 %
Lymphocytes Absolute: 2 10*3/uL (ref 0.7–3.1)
MCH: 30.8 pg (ref 26.6–33.0)
MCHC: 34.7 g/dL (ref 31.5–35.7)
MCV: 89 fL (ref 79–97)
Monocytes Absolute: 0.4 10*3/uL (ref 0.1–0.9)
Monocytes: 9 %
NEUTROS ABS: 2.6 10*3/uL (ref 1.4–7.0)
NEUTROS PCT: 49 %
Platelets: 176 10*3/uL (ref 150–450)
RBC: 5.33 x10E6/uL (ref 4.14–5.80)
RDW: 14.3 % (ref 11.6–15.4)
WBC: 5.2 10*3/uL (ref 3.4–10.8)

## 2018-09-19 LAB — COMPREHENSIVE METABOLIC PANEL
ALBUMIN: 4.6 g/dL (ref 4.0–5.0)
ALT: 31 IU/L (ref 0–44)
AST: 20 IU/L (ref 0–40)
Albumin/Globulin Ratio: 2.2 (ref 1.2–2.2)
Alkaline Phosphatase: 66 IU/L (ref 39–117)
BUN / CREAT RATIO: 18 (ref 9–20)
BUN: 18 mg/dL (ref 6–24)
Bilirubin Total: 0.4 mg/dL (ref 0.0–1.2)
CALCIUM: 9.9 mg/dL (ref 8.7–10.2)
CO2: 23 mmol/L (ref 20–29)
Chloride: 98 mmol/L (ref 96–106)
Creatinine, Ser: 0.98 mg/dL (ref 0.76–1.27)
GFR, EST AFRICAN AMERICAN: 106 mL/min/{1.73_m2} (ref >59)
GFR, EST NON AFRICAN AMERICAN: 92 mL/min/{1.73_m2} (ref >59)
GLOBULIN, TOTAL: 2.1 g/dL (ref 1.5–4.5)
Glucose: 90 mg/dL (ref 65–99)
POTASSIUM: 3.3 mmol/L — AB (ref 3.5–5.2)
SODIUM: 139 mmol/L (ref 134–144)
TOTAL PROTEIN: 6.7 g/dL (ref 6.0–8.5)

## 2018-09-19 LAB — TSH: TSH: 2.14 u[IU]/mL (ref 0.450–4.500)

## 2018-09-21 ENCOUNTER — Telehealth: Payer: Self-pay | Admitting: *Deleted

## 2018-09-21 ENCOUNTER — Telehealth: Payer: Self-pay | Admitting: Family Medicine

## 2018-09-21 MED ORDER — POTASSIUM CHLORIDE CRYS ER 20 MEQ PO TBCR: 20.0000 meq | EXTENDED_RELEASE_TABLET | Freq: Every day | ORAL | 0 refills | Status: DC

## 2018-09-21 NOTE — Telephone Encounter (Signed)
Pt returned missed call.  Please call pt back. ° °Thanks, °[facility] °

## 2018-09-21 NOTE — Telephone Encounter (Signed)
Patient was advised. Patient to know if he can get FMLA for anxiety issues? Please advise?

## 2018-09-21 NOTE — Telephone Encounter (Signed)
Pt calling about 2 things:  Needing to know if Maurine Minister could write a note regarding why he left work on Sunday due to anxiety issues.  Also needing to know what can be done to cover himself for future times of leaving work due to anxiety and arthritis?   Please advise.  Thanks, [employer]

## 2018-09-21 NOTE — Telephone Encounter (Signed)
-----   Message from Tamsen Roers, [place] sent at 09/20/2018  5:43 PM EST ----- Blood counts back to normal but potassium still low. Recommend KCL 20 meq qd #30 and recheck level in 1 month.

## 2018-09-21 NOTE — Telephone Encounter (Signed)
Please advise 

## 2018-09-21 NOTE — Telephone Encounter (Signed)
Note written for Sunday/Monday anxiety attack when he left work after 2:00 am 09-17-18. New medication is to try to stabilize this issue to prevent further attacks. Recheck progress after being on the Sertraline 50 mg hs for 3-4 weeks.

## 2018-09-21 NOTE — Telephone Encounter (Signed)
LMOVM for pt to return call 

## 2018-09-21 NOTE — Telephone Encounter (Signed)
He would have to check with HR at his place of employment to get any paperwork. Hopefully, getting this medication titrated up to what he may need, will allow him to work without any need for lost time.

## 2018-09-21 NOTE — Telephone Encounter (Signed)
Patient was notified of results. Expressed understanding. Rx sent to pharmacy. 

## 2018-09-24 NOTE — Telephone Encounter (Signed)
LMOVM for pt to return call 

## 2018-10-09 NOTE — Progress Notes (Deleted)
       Patient: Russell Simon Male    DOB: 1971-04-06   48 y.o.   MRN: [id number] Visit Date: 10/09/2018  Today's Provider: Dortha Kern, PA   No chief complaint on file.  Subjective:     HPI   Generalized anxiety disorder From 09/18/2018-added sertraline 50 mg qd to Hydroxyzine prn given in the ER. Recheck labs and follow up pending reports.  Dizziness From 09/18/2018-labs checked, no changes.  Essential hypertension From 09/18/2018-Well controlled. Labs checked, showing potassium still low. Recommend KCL 20 meq qd #30 and recheck level in 1 month.  No Known Allergies   Current Outpatient Medications:  .  acetaminophen (TYLENOL) 500 MG tablet, Take 500 mg by mouth every 6 (six) hours as needed (pt takes two tablets as needed)., Disp: , Rfl:  .  hydrochlorothiazide (HYDRODIURIL) 25 MG tablet, Take 1 tablet (25 mg total) by mouth daily., Disp: 90 tablet, Rfl: 3 .  hydrOXYzine (VISTARIL) 25 MG capsule, Take 1 capsule (25 mg total) by mouth 3 (three) times daily as needed for anxiety., Disp: 30 capsule, Rfl: 0 .  potassium chloride SA (K-DUR,KLOR-CON) 20 MEQ tablet, Take 1 tablet (20 mEq total) by mouth daily., Disp: 30 tablet, Rfl: 0 .  sertraline (ZOLOFT) 50 MG tablet, Take 1 tablet (50 mg total) by mouth daily., Disp: 30 tablet, Rfl: 3  Review of Systems  Constitutional: Negative for appetite change, chills and fever.  Respiratory: Negative for chest tightness, shortness of breath and wheezing.   Cardiovascular: Negative for chest pain and palpitations.  Gastrointestinal: Negative for abdominal pain, nausea and vomiting.    Social History   Tobacco Use  . Smoking status: Never Smoker  . Smokeless tobacco: Never Used  Substance Use Topics  . Alcohol use: No    Alcohol/week: 0.0 standard drinks      Objective:   There were no vitals taken for this visit. There were no vitals filed for this visit.   Physical Exam      Assessment & Plan        Dortha Kern, PA  [facility]

## 2018-10-11 ENCOUNTER — Ambulatory Visit: Payer: Self-pay | Admitting: Family Medicine

## 2018-10-15 ENCOUNTER — Encounter: Payer: Self-pay | Admitting: Family Medicine

## 2018-10-15 ENCOUNTER — Ambulatory Visit: Payer: BLUE CROSS/BLUE SHIELD | Admitting: Family Medicine

## 2018-10-15 ENCOUNTER — Other Ambulatory Visit: Payer: Self-pay

## 2018-10-15 VITALS — BP 120/80 | HR 67 | Temp 98.1°F | Resp 16 | Wt 179.0 lb

## 2018-10-15 DIAGNOSIS — E876 Hypokalemia: Secondary | ICD-10-CM | POA: Diagnosis not present

## 2018-10-15 DIAGNOSIS — F411 Generalized anxiety disorder: Secondary | ICD-10-CM | POA: Diagnosis not present

## 2018-10-15 DIAGNOSIS — I10 Essential (primary) hypertension: Secondary | ICD-10-CM | POA: Diagnosis not present

## 2018-10-15 NOTE — Progress Notes (Signed)
Patient: Russell Simon Male    DOB: [birth year]   48 y.o.   MRN: [id number] Visit Date: 10/15/2018  Today's Provider: Dortha Kern, PA   Chief Complaint  Patient presents with  . Follow-up   Subjective:     HPI   Generalized anxiety disorder From 09/18/2018-add sertraline (ZOLOFT) 50 MG tablet; Take 1 tablet (50 mg total) by mouth qd to the prn use of Hydroxyzine given in the ER. Recheck labs and follow up pending reports.  Dizziness From 09/18/2018-labs checked. Blood counts back to normal but potassium still low. Recommend KCL 20 meq qd #30 and recheck level in 1 month.  Essential hypertension From 09/18/2018-labs checked. Blood counts back to normal but potassium still low. Recommend KCL 20 meq qd #30 and recheck level in 1 month.  Past Medical History:  Diagnosis Date  . Hypertension    Past Surgical History:  Procedure Laterality Date  . TONSILLECTOMY AND ADENOIDECTOMY  1983   Family History  Problem Relation Age of Onset  . Aneurysm Maternal Grandmother   . Diabetes Maternal Grandfather   . CVA Paternal Grandmother   . Lung cancer Paternal Grandfather   . Hypertension Mother   . Hypertension Father   . Urolithiasis Father    Patient Active Problem List   Diagnosis Date Noted  . Essential hypertension 04/03/2017  . Low back pain 03/04/2015   No Known Allergies  Current Outpatient Medications:  .  acetaminophen (TYLENOL) 500 MG tablet, Take 500 mg by mouth every 6 (six) hours as needed (pt takes two tablets as needed)., Disp: , Rfl:  .  hydrochlorothiazide (HYDRODIURIL) 25 MG tablet, Take 1 tablet (25 mg total) by mouth daily., Disp: 90 tablet, Rfl: 3 .  hydrOXYzine (VISTARIL) 25 MG capsule, Take 1 capsule (25 mg total) by mouth 3 (three) times daily as needed for anxiety., Disp: 30 capsule, Rfl: 0 .  potassium chloride SA (K-DUR,KLOR-CON) 20 MEQ tablet, Take 1 tablet (20 mEq total) by mouth daily., Disp: 30 tablet, Rfl: 0 .  sertraline (ZOLOFT)  50 MG tablet, Take 1 tablet (50 mg total) by mouth daily., Disp: 30 tablet, Rfl: 3  Review of Systems  Constitutional: Negative for appetite change, chills and fever.  Respiratory: Negative for chest tightness, shortness of breath and wheezing.   Cardiovascular: Negative for chest pain and palpitations.  Gastrointestinal: Negative for abdominal pain, nausea and vomiting.   Social History   Tobacco Use  . Smoking status: Never Smoker  . Smokeless tobacco: Never Used  Substance Use Topics  . Alcohol use: No    Alcohol/week: 0.0 standard drinks     Objective:   BP 120/80 (BP Location: Right Arm, Patient Position: Sitting, Cuff Size: Large)   Pulse 67   Temp 98.1 F (36.7 C) (Oral)   Resp 16   Wt 179 lb (81.2 kg)   SpO2 97%   BMI 28.04 kg/m  Vitals:   10/15/18 1525  BP: 120/80  Pulse: 67  Resp: 16  Temp: 98.1 F (36.7 C)  TempSrc: Oral  SpO2: 97%  Weight: 179 lb (81.2 kg)   Physical Exam Constitutional:      General: He is not in acute distress.    Appearance: He is well-developed.  HENT:     Head: Normocephalic and atraumatic.     Right Ear: Hearing normal.     Left Ear: Hearing normal.     Nose: Nose normal.  Eyes:     General: Lids  are normal. No scleral icterus.       Right eye: No discharge.        Left eye: No discharge.     Conjunctiva/sclera: Conjunctivae normal.  Neck:     Musculoskeletal: Neck supple.  Cardiovascular:     Rate and Rhythm: Normal rate and regular rhythm.     Heart sounds: Normal heart sounds.  Pulmonary:     Effort: Pulmonary effort is normal. No respiratory distress.  Musculoskeletal: Normal range of motion.  Lymphadenopathy:     Cervical: Cervical adenopathy present.  Skin:    Findings: No lesion or rash.  Neurological:     Mental Status: He is alert and oriented to person, place, and time.  Psychiatric:        Speech: Speech normal.        Behavior: Behavior normal.        Thought Content: Thought content normal.        Assessment & Plan    1. Hypokalemia Potassium low at 3.3 on 09-18-18 and started on KCL 20 meq qd. No muscle weakness, cramps or palpitations. Will recheck BMP to see if KCL supplement needed to be continued with the Hydrodiuril daily. - Basic Metabolic Panel (BMET)  2. Essential hypertension Well controlled BP and tolerating the Hydrodiuril 25 mg qd. Recheck electrolytes and follow up BP in 3-4 months. - Basic Metabolic Panel (BMET)  3. Generalized anxiety disorder Improved nervousness/anxiety with the Zoloft 50 mg qd regularly and Hydroxyzine 25 mg TID prn only. Recommend exercise 30-40 minutes 3-4 days a week and good balanced diet with fresh fruits and vegetables. Sleep pattern improving, also.     Dortha Kern, PA  [facility]

## 2018-10-16 ENCOUNTER — Telehealth: Payer: Self-pay | Admitting: *Deleted

## 2018-10-16 LAB — BASIC METABOLIC PANEL
BUN / CREAT RATIO: 13 (ref 9–20)
BUN: 14 mg/dL (ref 6–24)
CO2: 22 mmol/L (ref 20–29)
CREATININE: 1.1 mg/dL (ref 0.76–1.27)
Calcium: 9.4 mg/dL (ref 8.7–10.2)
Chloride: 105 mmol/L (ref 96–106)
GFR calc Af Amer: 93 mL/min/{1.73_m2} (ref >59)
GFR, EST NON AFRICAN AMERICAN: 80 mL/min/{1.73_m2} (ref >59)
Glucose: 91 mg/dL (ref 65–99)
Potassium: 3.8 mmol/L (ref 3.5–5.2)
SODIUM: 141 mmol/L (ref 134–144)

## 2018-10-16 NOTE — Telephone Encounter (Signed)
-----   Message from Jodell Cipro Terril, [place] sent at 10/16/2018 12:05 PM EDT ----- Potassium back in the normal range. Eat fresh vegetables and fruits to maintain potassium level. No additional potassium tablets needed. Continue Sertraline 50 mg daily and recheck as planned in 3 months. May schedule a Webex appointment if needed before that follow up time.

## 2018-10-16 NOTE — Telephone Encounter (Signed)
LMOVM for pt to return call 

## 2018-10-16 NOTE — Telephone Encounter (Signed)
Patient was advised and states he will call back if to schedule appointment.

## 2018-10-24 DIAGNOSIS — S335XXA Sprain of ligaments of lumbar spine, initial encounter: Secondary | ICD-10-CM | POA: Diagnosis not present

## 2019-01-17 ENCOUNTER — Ambulatory Visit: Payer: BC Managed Care – PPO | Admitting: Family Medicine

## 2019-01-17 ENCOUNTER — Other Ambulatory Visit: Payer: Self-pay

## 2019-01-17 ENCOUNTER — Encounter: Payer: Self-pay | Admitting: Family Medicine

## 2019-01-17 VITALS — BP 140/90 | HR 100 | Temp 98.5°F | Wt 178.0 lb

## 2019-01-17 DIAGNOSIS — S63501A Unspecified sprain of right wrist, initial encounter: Secondary | ICD-10-CM

## 2019-01-17 NOTE — Patient Instructions (Signed)
Wrist Pain, Adult There are many things that can cause wrist pain. Some common causes include:  An injury to the wrist area, such as a sprain, strain, or fracture.  Overuse of the joint.  A condition that causes increased pressure on a nerve in the wrist (carpal tunnel syndrome).  Wear and tear of the joints that occurs with aging (osteoarthritis).  A variety of other types of arthritis. Sometimes, the cause of wrist pain is not known. Often, the pain goes away when you follow instructions from your health care provider for relieving pain at home, such as resting or icing the wrist. If your wrist pain continues, it is important to tell your health care provider. Follow these instructions at home:  Rest the wrist area for at least 48 hours or as long as told by your health care provider.  If a splint or elastic bandage has been applied, use it as told by your health care provider. ? Remove the splint or bandage only as told by your health care provider. ? Loosen the splint or bandage if your fingers tingle, become numb, or turn cold or blue.  If directed, apply ice to the injured area. ? If you have a removable splint or elastic bandage, remove it as told by your health care provider. ? Put ice in a plastic bag. ? Place a towel between your skin and the bag or between your splint or bandage and the bag. ? Leave the ice on for 20 minutes, 2-3 times a day.   Keep your arm raised (elevated) above the level of your heart while you are sitting or lying down.  Take over-the-counter and prescription medicines only as told by your health care provider.  Keep all follow-up visits as told by your health care provider. This is important. Contact a health care provider if:  You have a sudden sharp pain in the wrist, hand, or arm that is different or new.  The swelling or bruising on your wrist or hand gets worse.  Your skin becomes red, gets a rash, or has open sores.  Your pain does not  get better or it gets worse. Get help right away if:  You lose feeling in your fingers or hand.  Your fingers turn white, very red, or cold and blue.  You cannot move your fingers.  You have a fever or chills. This information is not intended to replace advice given to you by your health care provider. Make sure you discuss any questions you have with your health care provider. Document Released: 04/20/2005 Document Revised: 02/04/2016 Document Reviewed: 01/28/2016 Elsevier Interactive Patient Education  2019 [employer]. Wrist Splint, Adult A wrist splint holds your wrist in a position so it does not move. A splint supports your wrist like a cast, but it is not stiff like a cast (is flexible). You can take it off or make it looser. You may need a wrist splint if you hurt your wrist or have swelling in your wrist. A splint can:  Support your wrist.  Protect your wrist when it is hurt (injured).  Help you not hurt your wrist again.  Make your wrist stay still and not move.  Lessen pain.  Help your wrist heal. It is important to wear your splint as told by your doctor. This helps you make sure that your wrist heals the right way. What are the risks? If you wear your splint too tight or you have a lot of swelling, blood may  not be able to go to your wrist or hand. If this happens, you can get a condition called compartment syndrome. It can be dangerous and cause damage that lasts. Symptoms are:  Pain in your wrist that gets worse.  Tingling.  Having no feeling in your wrist or hand. This is called numbness.  Changes in skin color. The skin may look very light (pale) or kind of blue.  Cold fingers. Other risks of wearing a splint may be:  A stiff wrist.  A weak wrist.  Skin irritation that can cause: ? Itching. ? Rash. ? Sores. ? Infection. How to use your wrist splint Your wrist splint should be tight enough to support your wrist. It should not block blood from  going to your hand or wrist. Your doctor will tell you how to wear your wrist splint and how long to wear it. Splint wear  Wear the splint as told by your doctor. Only take it off as told by your doctor.  Loosen the splint if your fingers tingle, get numb, or turn cold and blue.  Keep the splint clean.  If the splint is not waterproof: ? Do not let it get wet. ? Cover it with a watertight covering when you take a bath or a shower  Do not stick anything inside the splint to scratch your skin. Doing that increases your risk of infection.  Check the skin under the splint every time you take it off. Check for any redness or blisters. Tell your doctor about any skin problems. Managing pain, stiffness, and swelling   If directed, put ice on the injured area. ? If you a have a splint that can be taken off, take it off as told by your doctor. ? Put ice in a plastic bag. ? Place a towel between your skin and the bag. ? Leave the ice on for 20 minutes, 2-3 times a day.  Move your fingers often to avoid stiffness and to lessen swelling.  Raise (elevate) the injured area above the level of your heart while you are sitting or lying down. Activity  Return to your normal activities as told by your doctor. Ask your doctor what activities are safe for you.  Do exercises as told by your doctor.  Ask your doctor when it is safe to drive with a splint on your wrist. General instructions  Do not use the injured limb to support (bear) your body weight until your doctor says that you can.  Do not put pressure on any part of the splint until it is fully hardened. This may take many hours.  Do not use any products that have nicotine or tobacco in them, such as cigarettes and e-cigarettes. If you need help quitting, ask your doctor.  Take over-the-counter and prescription medicines only as told by your doctor.  Keep all follow-up visits as told by your doctor. This is important. Get help if:   You have wrist pain or swelling that does not go away.  The skin around or under your splint gets red, itchy, or moist.  You have chills or fever.  Your splint feels too tight or too loose.  Your splint breaks. Get help right away if:  You have pain that gets worse.  You have tingling and numbness.  You have changes in skin color, including paleness or a bluish color.  Your fingers are cold. Summary  A wrist splint is a flexible device that supports your wrist and keeps your wrist  from moving.  It is important to wear your splint as told by your doctor. This helps to make sure that your wrist heals correctly.  Icing, moving your fingers, and raising your wrist above the level of your heart will help you manage pain, stiffness, and swelling.  Your wrist splint should be tight enough to support your wrist. It should not block your blood supply.  Get help right away if your fingers tingle, get numb, or turn cold and blue. Loosen the splint right away. This information is not intended to replace advice given to you by your health care provider. Make sure you discuss any questions you have with your health care provider. Document Released: 12/28/2007 Document Revised: 09/28/2016 Document Reviewed: 09/28/2016 Elsevier Interactive Patient Education  2019 [employer].

## 2019-01-17 NOTE — Progress Notes (Signed)
Russell SizerWilliam C Simon  MRN: [id number] DOB: Jul 26, 1970  Subjective:  HPI   The patient is a 48 year old male who presents with complaint of right wrist pain.  He states he has had no known trauma.  He has been having pain for a few weeks.  He states it is worse with certain movements and when applying pressure with that hand.  He does not think that he has lost any strength in that hand.   He had tried some topical cream with no relief but has not used any heat or ice.     Patient Active Problem List   Diagnosis Date Noted  . Generalized anxiety disorder 10/15/2018  . Essential hypertension 04/03/2017  . Low back pain 03/04/2015    Past Medical History:  Diagnosis Date  . Hypertension    Past Surgical History:  Procedure Laterality Date  . TONSILLECTOMY AND ADENOIDECTOMY  1983   Family History  Problem Relation Age of Onset  . Aneurysm Maternal Grandmother   . Diabetes Maternal Grandfather   . CVA Paternal Grandmother   . Lung cancer Paternal Grandfather   . Hypertension Mother   . Hypertension Father   . Urolithiasis Father    Social History   Socioeconomic History  . Marital status: Single    Spouse name: Not on file  . Number of children: Not on file  . Years of education: Not on file  . Highest education level: Not on file  Occupational History  . Not on file  Social Needs  . Financial resource strain: Not on file  . Food insecurity    Worry: Not on file    Inability: Not on file  . Transportation needs    Medical: Not on file    Non-medical: Not on file  Tobacco Use  . Smoking status: Never Smoker  . Smokeless tobacco: Never Used  Substance and Sexual Activity  . Alcohol use: No    Alcohol/week: 0.0 standard drinks  . Drug use: No  . Sexual activity: Not on file  Lifestyle  . Physical activity    Days per week: Not on file    Minutes per session: Not on file  . Stress: Not on file  Relationships  . Social Musicianconnections    Talks on phone: Not on  file    Gets together: Not on file    Attends religious service: Not on file    Active member of club or organization: Not on file    Attends meetings of clubs or organizations: Not on file    Relationship status: Not on file  . Intimate partner violence    Fear of current or ex partner: Not on file    Emotionally abused: Not on file    Physically abused: Not on file    Forced sexual activity: Not on file  Other Topics Concern  . Not on file  Social History Narrative  . Not on file   Outpatient Encounter Medications as of 01/17/2019  Medication Sig  . acetaminophen (TYLENOL) 500 MG tablet Take 500 mg by mouth every 6 (six) hours as needed (pt takes two tablets as needed).  . hydrochlorothiazide (HYDRODIURIL) 25 MG tablet Take 1 tablet (25 mg total) by mouth daily.  . hydrOXYzine (VISTARIL) 25 MG capsule Take 1 capsule (25 mg total) by mouth 3 (three) times daily as needed for anxiety.  . potassium chloride SA (K-DUR,KLOR-CON) 20 MEQ tablet Take 1 tablet (20 mEq total) by mouth daily.  .Marland Kitchen  sertraline (ZOLOFT) 50 MG tablet Take 1 tablet (50 mg total) by mouth daily.   No facility-administered encounter medications on file as of 01/17/2019.    No Known Allergies  Review of Systems  Musculoskeletal: Positive for joint pain (righ wrist). Negative for falls and myalgias.    Objective:  BP 140/90 (BP Location: Right Arm, Patient Position: Sitting, Cuff Size: Normal)   Pulse 100   Temp 98.5 F (36.9 C) (Oral)   Wt 178 lb (80.7 kg)   SpO2 97%   BMI 27.88 kg/m   BP Readings from Last 3 Encounters:  01/17/19 140/90  10/15/18 120/80  09/18/18 138/84   Physical Exam  Constitutional: He is oriented to person, place, and time and well-developed, well-nourished, and in no distress.  HENT:  Head: Normocephalic.  Eyes: Conjunctivae are normal.  Neck: Neck supple.  Pulmonary/Chest: Effort normal.  Abdominal: Soft.  Musculoskeletal:        General: Tenderness present.     Comments:  Tender ulnar side of the right wrist. Increased pain to flex, extend or test grip strength. Good pulses. No neurologic deficits.   Neurological: He is alert and oriented to person, place, and time.  Skin: No rash noted.  Psychiatric: Mood, affect and judgment normal.    Assessment and Plan :   1. Sprain of right wrist, initial encounter Onset over the past few weeks with certain movements. Suspect flexor carpi radialis tendinitis/sprain of the right wrist. May apply moist heat or ice pack prn and take Ibuprofen 200 mg 3-4 tablets TID with food. Also, use a wrist splint for support and limit lifting. Recheck if no better in 7-10 days.

## 2019-02-05 ENCOUNTER — Ambulatory Visit
Admission: RE | Admit: 2019-02-05 | Discharge: 2019-02-05 | Disposition: A | Discharge status: Home / Self Care | Payer: BC Managed Care – PPO | Source: Ambulatory Visit | Attending: Family Medicine | Admitting: Family Medicine

## 2019-02-05 ENCOUNTER — Other Ambulatory Visit: Payer: Self-pay

## 2019-02-05 ENCOUNTER — Ambulatory Visit: Payer: BC Managed Care – PPO | Admitting: Family Medicine

## 2019-02-05 ENCOUNTER — Other Ambulatory Visit: Payer: Self-pay | Admitting: Family Medicine

## 2019-02-05 ENCOUNTER — Encounter: Payer: Self-pay | Admitting: Family Medicine

## 2019-02-05 VITALS — BP 130/80 | HR 58 | Temp 98.9°F | Wt 177.0 lb

## 2019-02-05 DIAGNOSIS — M25531 Pain in right wrist: Secondary | ICD-10-CM | POA: Insufficient documentation

## 2019-02-05 NOTE — Progress Notes (Signed)
Russell Simon  MRN: [id number] DOB: [birth year]  Subjective:  HPI   The patient is a 48 year old male who presents today for continued pain of the right wrist. He was last seen on 01/17/19 and was advised to use splint for support, Ibuprofen for pain and return in 7-10 days if not better. The patient states he is not any worse but feels it is certainly not any better.  He reports he has been wearing the splint, however, does not have it on today.  He has been using Ibuprofen and reports that it helps the pain a little.  He is still unable to move the wrist without it causing significant pain. He describes the pain as being burning at times and said that it radiates about 3 inches above the wrist.    Patient Active Problem List   Diagnosis Date Noted  . Generalized anxiety disorder 10/15/2018  . Essential hypertension 04/03/2017  . Low back pain 03/04/2015   Past Medical History:  Diagnosis Date  . Hypertension    Social History   Socioeconomic History  . Marital status: Single    Spouse name: Not on file  . Number of children: Not on file  . Years of education: Not on file  . Highest education level: Not on file  Occupational History  . Not on file  Social Needs  . Financial resource strain: Not on file  . Food insecurity    Worry: Not on file    Inability: Not on file  . Transportation needs    Medical: Not on file    Non-medical: Not on file  Tobacco Use  . Smoking status: Never Smoker  . Smokeless tobacco: Never Used  Substance and Sexual Activity  . Alcohol use: No    Alcohol/week: 0.0 standard drinks  . Drug use: No  . Sexual activity: Not on file  Lifestyle  . Physical activity    Days per week: Not on file    Minutes per session: Not on file  . Stress: Not on file  Relationships  . Social Herbalist on phone: Not on file    Gets together: Not on file    Attends religious service: Not on file    Active member of club or organization: Not  on file    Attends meetings of clubs or organizations: Not on file    Relationship status: Not on file  . Intimate partner violence    Fear of current or ex partner: Not on file    Emotionally abused: Not on file    Physically abused: Not on file    Forced sexual activity: Not on file  Other Topics Concern  . Not on file  Social History Narrative  . Not on file   Outpatient Encounter Medications as of 02/05/2019  Medication Sig  . acetaminophen (TYLENOL) 500 MG tablet Take 500 mg by mouth every 6 (six) hours as needed (pt takes two tablets as needed).  . hydrochlorothiazide (HYDRODIURIL) 25 MG tablet Take 1 tablet (25 mg total) by mouth daily.  . hydrOXYzine (VISTARIL) 25 MG capsule Take 1 capsule (25 mg total) by mouth 3 (three) times daily as needed for anxiety.  . potassium chloride SA (K-DUR,KLOR-CON) 20 MEQ tablet Take 1 tablet (20 mEq total) by mouth daily.  . sertraline (ZOLOFT) 50 MG tablet Take 1 tablet (50 mg total) by mouth daily.   No facility-administered encounter medications on file as of 02/05/2019.  No Known Allergies  Review of Systems  Musculoskeletal: Positive for joint pain (right wrist).       Decreased range of motion.    Objective:  BP 130/80 (BP Location: Left Arm, Patient Position: Sitting, Cuff Size: Normal)   Pulse (!) 58   Temp 98.9 F (37.2 C) (Oral)   Wt 177 lb (80.3 kg)   SpO2 99%   BMI 27.72 kg/m   Physical Exam  Constitutional: He is oriented to person, place, and time and well-developed, well-nourished, and in no distress.  HENT:  Head: Normocephalic.  Eyes: Conjunctivae are normal.  Neck: Neck supple.  Pulmonary/Chest: Effort normal.  Abdominal: Soft.  Musculoskeletal:        General: Tenderness present.     Comments: Sharp pains in the right wrist over the ulnar styloid process to test grip or lateral and volar flexion. Good pulses without numbness.  Neurological: He is alert and oriented to person, place, and time.  Skin: No  rash noted.  Psychiatric: Mood, affect and judgment normal.    Assessment and Plan :   1. Right wrist pain Persistent pain in the ulnar side of the right wrist for the past 3-4 weeks. Has tried Ibuprofen 600 mg 2-3 times a day and intermittent use of a wrist splint the past 2 weeks. Will get x-ray evaluation and recommend he use the Ibuprofen and wrist splint continuously. May need referral to an orthopedist prn x-ray report. Use splint at night, also. - DG Wrist Complete Right

## 2019-02-07 ENCOUNTER — Telehealth: Payer: Self-pay

## 2019-02-07 NOTE — Telephone Encounter (Signed)
Patient notified of results and referral 

## 2019-02-07 NOTE — Telephone Encounter (Signed)
-----   Message from Margo Common, [place] sent at 02/05/2019  4:50 PM EDT ----- No bony abnormality on x-ray. Proceed with wrist splint day and night. Will schedule orthopedic referral.

## 2019-02-07 NOTE — Telephone Encounter (Signed)
LMTCB

## 2019-02-08 DIAGNOSIS — M25531 Pain in right wrist: Secondary | ICD-10-CM | POA: Diagnosis not present

## 2019-02-12 NOTE — Telephone Encounter (Signed)
LMTCB

## 2019-02-13 NOTE — Telephone Encounter (Signed)
Pt returned missed call.   Please call pt back after 1 pm today.  Thanks, [employer]

## 2019-05-21 DIAGNOSIS — Z20828 Contact with and (suspected) exposure to other viral communicable diseases: Secondary | ICD-10-CM | POA: Diagnosis not present

## 2019-05-24 ENCOUNTER — Other Ambulatory Visit: Payer: Self-pay | Admitting: Family Medicine

## 2019-05-24 DIAGNOSIS — I10 Essential (primary) hypertension: Secondary | ICD-10-CM

## 2019-08-28 DIAGNOSIS — R05 Cough: Secondary | ICD-10-CM | POA: Diagnosis not present

## 2019-08-28 DIAGNOSIS — Z20822 Contact with and (suspected) exposure to covid-19: Secondary | ICD-10-CM | POA: Diagnosis not present

## 2019-09-13 DIAGNOSIS — Z20822 Contact with and (suspected) exposure to covid-19: Secondary | ICD-10-CM | POA: Diagnosis not present

## 2019-09-16 DIAGNOSIS — Z20822 Contact with and (suspected) exposure to covid-19: Secondary | ICD-10-CM | POA: Diagnosis not present

## 2019-09-18 NOTE — Progress Notes (Signed)
Russell Simon  MRN: [id number] DOB: [birth year]  Subjective:  HPI  The patient is a 49 year old male who presents via phone visit due to inability to connect for video visit.  He tested positive for Covid on 08/28/19.  He continues to have symptoms.  Virtual Visit via Telephone Note  I connected with Russell Simon on 09/19/19 at  1:20 PM EST by telephone and verified that I am speaking with the correct person using two identifiers.  Location: Patient: home Provider: office   I discussed the limitations, risks, security and privacy concerns of performing an evaluation and management service by telephone and the availability of in person appointments. I also discussed with the patient that there may be a patient responsible charge related to this service. The patient expressed understanding and agreed to proceed.   Patient Active Problem List   Diagnosis Date Noted  . Generalized anxiety disorder 10/15/2018  . Essential hypertension 04/03/2017  . Low back pain 03/04/2015    Past Medical History:  Diagnosis Date  . Hypertension    Past Surgical History:  Procedure Laterality Date  . TONSILLECTOMY AND ADENOIDECTOMY  1983   Family History  Problem Relation Age of Onset  . Aneurysm Maternal Grandmother   . Diabetes Maternal Grandfather   . CVA Paternal Grandmother   . Lung cancer Paternal Grandfather   . Hypertension Mother   . Hypertension Father   . Urolithiasis Father     Social History   Socioeconomic History  . Marital status: Single    Spouse name: Not on file  . Number of children: Not on file  . Years of education: Not on file  . Highest education level: Not on file  Occupational History  . Not on file  Tobacco Use  . Smoking status: Never Smoker  . Smokeless tobacco: Never Used  Substance and Sexual Activity  . Alcohol use: No    Alcohol/week: 0.0 standard drinks  . Drug use: No  . Sexual activity: Not on file  Other Topics Concern  . Not on  file  Social History Narrative  . Not on file   Social Determinants of Health   Financial Resource Strain:   . Difficulty of Paying Living Expenses: Not on file  Food Insecurity:   . Worried About Programme researcher, broadcasting/film/video in the Last Year: Not on file  . Ran Out of Food in the Last Year: Not on file  Transportation Needs:   . Lack of Transportation (Medical): Not on file  . Lack of Transportation (Non-Medical): Not on file  Physical Activity:   . Days of Exercise per Week: Not on file  . Minutes of Exercise per Session: Not on file  Stress:   . Feeling of Stress : Not on file  Social Connections:   . Frequency of Communication with Friends and Family: Not on file  . Frequency of Social Gatherings with Friends and Family: Not on file  . Attends Religious Services: Not on file  . Active Member of Clubs or Organizations: Not on file  . Attends Banker Meetings: Not on file  . Marital Status: Not on file  Intimate Partner Violence:   . Fear of Current or Ex-Partner: Not on file  . Emotionally Abused: Not on file  . Physically Abused: Not on file  . Sexually Abused: Not on file    Outpatient Encounter Medications as of 09/19/2019  Medication Sig  . acetaminophen (TYLENOL) 500 MG tablet Take  500 mg by mouth every 6 (six) hours as needed (pt takes two tablets as needed).  . hydrochlorothiazide (HYDRODIURIL) 25 MG tablet TAKE 1 TABLET BY MOUTH EVERY DAY  . hydrOXYzine (VISTARIL) 25 MG capsule Take 1 capsule (25 mg total) by mouth 3 (three) times daily as needed for anxiety. (Patient not taking: Reported on 09/18/2019)  . potassium chloride SA (K-DUR,KLOR-CON) 20 MEQ tablet Take 1 tablet (20 mEq total) by mouth daily. (Patient not taking: Reported on 09/18/2019)  . sertraline (ZOLOFT) 50 MG tablet Take 1 tablet (50 mg total) by mouth daily. (Patient not taking: Reported on 09/18/2019)   No facility-administered encounter medications on file as of 09/19/2019.   No Known  Allergies  Review of Systems  Constitutional: Positive for malaise/fatigue. Negative for chills, diaphoresis and fever.  HENT: Positive for congestion. Negative for ear pain, sinus pain and sore throat.   Respiratory: Positive for cough and shortness of breath. Negative for sputum production and wheezing.   Cardiovascular: Negative for chest pain, palpitations and orthopnea.  Gastrointestinal: Negative for abdominal pain and diarrhea.  Musculoskeletal: Negative for myalgias.  Neurological: Positive for headaches. Negative for dizziness.    Objective:  BP (!) 148/87   Temp 97.6 F (36.4 C)   Physical Exam: Short of breath with talking on the phone. Has to stop to catch his breath frequently. Cognitively intact.  Assessment and Plan :   1. COVID-19 virus infection Had a positive COVID test on 08-28-19 without symptoms. On 08-30-19, he lost his sense of taste and smell with headache, cough, chest pressure and fatigue. He was treated symptomatically and isolated at home for 2 weeks. His place of work required 2 negative COVID tests before returning to work. Test on 09-13-19 was negative and on 09-16-19 it was inconclusive. He has had a return of shortness of breath with occasional cough over the past week. No fever today and drinking plenty of fluids. Sense of taste has not returned. His father was COVID positive on 08-26-19 and mother was COVID positive on 09-16-19 (this patient lives with them). With a return of his dyspnea, he will not be able to return to work until 09-03-19. Will recheck progress 09-23-19. Completed FMLA/disability forms.  2. Dyspnea on exertion Short of breath to walk short distances at home or talk for a few minutes. Recommend he continue Mucinex-DM prn cough or congestion and may use Tylenol or Advil prn aches or pains. Increase fluid intake and call back report of progress in 3 days.  I discussed the assessment and treatment plan with the patient. The patient was provided an  opportunity to ask questions and all were answered. The patient agreed with the plan and demonstrated an understanding of the instructions.   The patient was advised to call back or seek an in-person evaluation if the symptoms worsen or if the condition fails to improve as anticipated.  I provided 18 minutes of non-face-to-face time during this encounter.

## 2019-09-19 ENCOUNTER — Encounter: Payer: Self-pay | Admitting: Family Medicine

## 2019-09-19 ENCOUNTER — Ambulatory Visit (INDEPENDENT_AMBULATORY_CARE_PROVIDER_SITE_OTHER): Payer: BC Managed Care – PPO | Admitting: Family Medicine

## 2019-09-19 VITALS — BP 148/87 | Temp 97.6°F

## 2019-09-19 DIAGNOSIS — U071 COVID-19: Secondary | ICD-10-CM | POA: Diagnosis not present

## 2019-09-19 DIAGNOSIS — R0609 Other forms of dyspnea: Secondary | ICD-10-CM

## 2019-09-20 ENCOUNTER — Encounter: Payer: Self-pay | Admitting: Family Medicine

## 2019-09-21 ENCOUNTER — Encounter: Payer: Self-pay | Admitting: Family Medicine

## 2019-09-23 ENCOUNTER — Other Ambulatory Visit: Payer: Self-pay

## 2019-09-23 ENCOUNTER — Emergency Department: Payer: BC Managed Care – PPO

## 2019-09-23 ENCOUNTER — Emergency Department
Admission: EM | Admit: 2019-09-23 | Discharge: 2019-09-23 | Disposition: A | Discharge status: Home / Self Care | Payer: BC Managed Care – PPO | Attending: Emergency Medicine | Admitting: Emergency Medicine

## 2019-09-23 ENCOUNTER — Ambulatory Visit: Payer: Self-pay

## 2019-09-23 DIAGNOSIS — I10 Essential (primary) hypertension: Secondary | ICD-10-CM | POA: Diagnosis not present

## 2019-09-23 DIAGNOSIS — Z8616 Personal history of COVID-19: Secondary | ICD-10-CM | POA: Insufficient documentation

## 2019-09-23 DIAGNOSIS — R0602 Shortness of breath: Secondary | ICD-10-CM | POA: Diagnosis not present

## 2019-09-23 DIAGNOSIS — Z79899 Other long term (current) drug therapy: Secondary | ICD-10-CM | POA: Diagnosis not present

## 2019-09-23 DIAGNOSIS — R0789 Other chest pain: Secondary | ICD-10-CM | POA: Diagnosis not present

## 2019-09-23 LAB — BASIC METABOLIC PANEL
Anion gap: 11 (ref 5–15)
BUN: 17 mg/dL (ref 6–20)
CO2: 24 mmol/L (ref 22–32)
Calcium: 10.2 mg/dL (ref 8.9–10.3)
Chloride: 104 mmol/L (ref 98–111)
Creatinine, Ser: 1.07 mg/dL (ref 0.61–1.24)
GFR calc Af Amer: >60 mL/min (ref >60)
GFR calc non Af Amer: >60 mL/min (ref >60)
Glucose, Bld: 100 mg/dL — ABNORMAL HIGH (ref 70–99)
Potassium: 3.5 mmol/L (ref 3.5–5.1)
Sodium: 139 mmol/L (ref 135–145)

## 2019-09-23 LAB — CBC
HCT: 49.1 % (ref 39.0–52.0)
Hemoglobin: 17.1 g/dL — ABNORMAL HIGH (ref 13.0–17.0)
MCH: 29.1 pg (ref 26.0–34.0)
MCHC: 34.8 g/dL (ref 30.0–36.0)
MCV: 83.6 fL (ref 80.0–100.0)
Platelets: 182 10*3/uL (ref 150–400)
RBC: 5.87 MIL/uL — ABNORMAL HIGH (ref 4.22–5.81)
RDW: 13 % (ref 11.5–15.5)
WBC: 5.8 10*3/uL (ref 4.0–10.5)
nRBC: 0 % (ref 0.0–0.2)

## 2019-09-23 LAB — TROPONIN I (HIGH SENSITIVITY): Troponin I (High Sensitivity): 3 ng/L (ref <18)

## 2019-09-23 MED ORDER — IOHEXOL 350 MG/ML SOLN
75.0000 mL | Freq: Once | INTRAVENOUS | Status: AC | PRN
  Administered 2019-09-23: 75 mL via INTRAVENOUS
  Filled 2019-09-23: qty 75

## 2019-09-23 MED ORDER — SODIUM CHLORIDE 0.9% FLUSH: 3.0000 mL | Freq: Once | INTRAVENOUS | Status: DC

## 2019-09-23 NOTE — ED Triage Notes (Signed)
Pt c/o substernal chest tightness with SOB since last night, states he was dx with covid about a month ago and has been having intermittent SOB with it since. Pt is in NAD at present.

## 2019-09-23 NOTE — ED Provider Notes (Signed)
[facility] Emergency Department Provider Note   ____________________________________________    I have reviewed the triage vital signs and the nursing notes.   HISTORY  Chief Complaint Shortness of Breath and Chest Pain     HPI Russell Simon is a 49 y.o. male who presents with complaints of shortness of breath and chest discomfort.  He reports shortness of breath is worse with exertion, has been ongoing since recovering from Covid last month.  Does not believe that he has had fevers.  Denies pleurisy.  Occasional cough.  Does not smoke.  Never had this before having novel coronavirus.  Denies dizziness or lightheadedness.  Has not take anything for this.  Past Medical History:  Diagnosis Date  . Hypertension     Patient Active Problem List   Diagnosis Date Noted  . Generalized anxiety disorder 10/15/2018  . Essential hypertension 04/03/2017  . Low back pain 03/04/2015    Past Surgical History:  Procedure Laterality Date  . TONSILLECTOMY AND ADENOIDECTOMY  1983    Prior to Admission medications   Medication Sig Start Date End Date Taking? Authorizing Provider  acetaminophen (TYLENOL) 500 MG tablet Take 500 mg by mouth every 6 (six) hours as needed (pt takes two tablets as needed).    [provider]  hydrochlorothiazide (HYDRODIURIL) 25 MG tablet TAKE 1 TABLET BY MOUTH EVERY DAY 05/24/19   Chrismon, Jodell Cipro, PA  hydrOXYzine (VISTARIL) 25 MG capsule Take 1 capsule (25 mg total) by mouth 3 (three) times daily as needed for anxiety. Patient not taking: Reported on 09/18/2019 09/18/18   Chrismon, Jodell Cipro, PA  potassium chloride SA (K-DUR,KLOR-CON) 20 MEQ tablet Take 1 tablet (20 mEq total) by mouth daily. Patient not taking: Reported on 09/18/2019 09/21/18   Chrismon, Jodell Cipro, PA  sertraline (ZOLOFT) 50 MG tablet Take 1 tablet (50 mg total) by mouth daily. Patient not taking: Reported on 09/18/2019 09/18/18   Chrismon, Jodell Cipro, PA    Allergies Patient has no known allergies.  Family History  Problem Relation Age of Onset  . Aneurysm Maternal Grandmother   . Diabetes Maternal Grandfather   . CVA Paternal Grandmother   . Lung cancer Paternal Grandfather   . Hypertension Mother   . Hypertension Father   . Urolithiasis Father     Social History Social History   Tobacco Use  . Smoking status: Never Smoker  . Smokeless tobacco: Never Used  Substance Use Topics  . Alcohol use: No    Alcohol/week: 0.0 standard drinks  . Drug use: No    Review of Systems  Constitutional: No fever/chills Eyes: No visual changes.  ENT: No sore throat. Cardiovascular: As above Respiratory: As above Gastrointestinal: No abdominal pain.  No nausea, no vomiting.   Genitourinary: Negative for dysuria. Musculoskeletal: Negative for back pain. Skin: Negative for rash. Neurological: Negative for headaches or weakness   ____________________________________________   PHYSICAL EXAM:  VITAL SIGNS: ED Triage Vitals [09/23/19 1324]  Enc Vitals Group     BP 127/90     Pulse Rate (!) 105     Resp 18     Temp 99 F (37.2 C)     Temp Source Oral     SpO2 98 %     Weight 77.1 kg (170 lb)     Height 1.702 m (5\' 7" )     Head Circumference      Peak Flow      Pain Score 5  Pain Loc      Pain Edu?      Excl. in [place]?     Constitutional: Alert and oriented.   Nose: No congestion/rhinnorhea. Mouth/Throat: Mucous membranes are moist.    Cardiovascular: Normal rate, regular rhythm.  Good peripheral circulation. Respiratory: Normal respiratory effort.  No retractions.  No wheezing Gastrointestinal: Soft and nontender. No distention.  No CVA tenderness.  Musculoskeletal: No lower extremity tenderness nor edema.  Warm and well perfused Neurologic:  Normal speech and language. No gross focal neurologic deficits are appreciated.  Skin:  Skin is warm, dry and intact. No rash noted. Psychiatric: Mood and affect are normal.  Speech and behavior are normal.  ____________________________________________   LABS (all labs ordered are listed, but only abnormal results are displayed)  Labs Reviewed  BASIC METABOLIC PANEL - Abnormal; Notable for the following components:      Result Value   Glucose, Bld 100 (*)    All other components within normal limits  CBC - Abnormal; Notable for the following components:   RBC 5.87 (*)    Hemoglobin 17.1 (*)    All other components within normal limits  TROPONIN I (HIGH SENSITIVITY)  TROPONIN I (HIGH SENSITIVITY)   ____________________________________________  EKG  ED ECG REPORT I, Lavonia Drafts, the attending physician, personally viewed and interpreted this ECG.  Date: 09/23/2019  Rhythm: normal sinus rhythm QRS Axis: normal Intervals: normal ST/T Wave abnormalities: normal Narrative Interpretation: no evidence of acute ischemia  ____________________________________________  RADIOLOGY  CT angiography negative for PE Dust x-ray viewed by me unremarkable ____________________________________________   PROCEDURES  Procedure(s) performed: No  Procedures   Critical Care performed: No ____________________________________________   INITIAL IMPRESSION / ASSESSMENT AND PLAN / ED COURSE  Pertinent labs & imaging results that were available during my care of the patient were reviewed by me and considered in my medical decision making (see chart for details).  Patient presents with shortness of breath with exertion since having COVID-19 last month.  Suspicious for post Covid shortness of breath versus less likely PE.  EKG and troponin are reassuring.  Lab work is unremarkable.  Discussed CT imaging with patient and he would like to proceed with CT angiography to rule out PE  CT angiography is negative, I will have him follow-up with pulmonology    ____________________________________________   FINAL CLINICAL IMPRESSION(S) / ED DIAGNOSES  Final  diagnoses:  History of COVID-19  Shortness of breath        Note:  This document was prepared using Dragon voice recognition software and may include unintentional dictation errors.   Lavonia Drafts, MD 09/23/19 [phone]

## 2019-09-23 NOTE — Telephone Encounter (Signed)
Patient called stating that he has Chest pain center of his chest and he feels SOB. The pain stays in the same area and does not travel. He states that he has no HX of heart Dx. He has no other symptoms. He rates pain at 5 and constant since last night. He has recently had COVID-19 February 2nd. He was tested negative on Feb 19th. He was retested on Feb 22 but that test was inconclusive. He states he was a little lightheaded a few days ago but that sensation is gone. Per protocol patient will go to ER for evaluation. Care advice read to patient. He verbalized understanding.  Reason for Disposition . Difficulty breathing  Answer Assessment - Initial Assessment Questions 1. LOCATION: "Where does it hurt?"       center 2. RADIATION: "Does the pain go anywhere else?" (e.g., into neck, jaw, arms, back)    no 3. ONSET: "When did the chest pain begin?" (Minutes, hours or days)     Last night 4. PATTERN "Does the pain come and go, or has it been constant since it started?"  "Does it get worse with exertion?"   constant 5. DURATION: "How long does it last" (e.g., seconds, minutes, hours)     constant 6. SEVERITY: "How bad is the pain?"  (e.g., Scale 1-10; mild, moderate, or severe)    - MILD (1-3): doesn't interfere with normal activities     - MODERATE (4-7): interferes with normal activities or awakens from sleep    - SEVERE (8-10): excruciating pain, unable to do any normal activities       5 7. CARDIAC RISK FACTORS: "Do you have any history of heart problems or risk factors for heart disease?" (e.g., angina, prior heart attack; diabetes, high blood pressure, high cholesterol, smoker, or strong family history of heart disease)    no 8. PULMONARY RISK FACTORS: "Do you have any history of lung disease?"  (e.g., blood clots in lung, asthma, emphysema, birth control pills)     no 9. CAUSE: "What do you think is causing the chest pain?"     unsure 10. OTHER SYMPTOMS: "Do you have any other symptoms?"  (e.g., dizziness, nausea, vomiting, sweating, fever, difficulty breathing, cough)       SOB,  11. PREGNANCY: "Is there any chance you are pregnant?" "When was your last menstrual period?"   NA  Protocols used: CHEST PAIN-A-AH

## 2019-09-26 ENCOUNTER — Encounter: Payer: Self-pay | Admitting: Family Medicine

## 2019-10-02 DIAGNOSIS — Z20822 Contact with and (suspected) exposure to covid-19: Secondary | ICD-10-CM | POA: Diagnosis not present

## 2019-10-04 DIAGNOSIS — Z20822 Contact with and (suspected) exposure to covid-19: Secondary | ICD-10-CM | POA: Diagnosis not present

## 2019-10-28 ENCOUNTER — Encounter: Payer: Self-pay | Admitting: Family Medicine

## 2019-10-28 ENCOUNTER — Ambulatory Visit (INDEPENDENT_AMBULATORY_CARE_PROVIDER_SITE_OTHER): Payer: BC Managed Care – PPO | Admitting: Family Medicine

## 2019-10-28 ENCOUNTER — Other Ambulatory Visit: Payer: Self-pay

## 2019-10-28 VITALS — BP 130/91 | HR 77 | Temp 96.9°F | Wt 177.0 lb

## 2019-10-28 DIAGNOSIS — Z8616 Personal history of COVID-19: Secondary | ICD-10-CM | POA: Diagnosis not present

## 2019-10-28 NOTE — Progress Notes (Signed)
Established patient visit      Patient: Russell Simon   DOB: 1971-02-19   49 y.o. Male  MRN: [id number] Visit Date: 10/28/2019  Today's healthcare provider: Dortha Kern, PA  Subjective:    Chief Complaint  Patient presents with  . FMLA form completion   HPI Patient presents today to request FMLA forms be completed.   Patient Active Problem List   Diagnosis Date Noted  . Generalized anxiety disorder 10/15/2018  . Essential hypertension 04/03/2017  . Low back pain 03/04/2015   Past Surgical History:  Procedure Laterality Date  . TONSILLECTOMY AND ADENOIDECTOMY  1983   Social History   Socioeconomic History  . Marital status: Single    Spouse name: Not on file  . Number of children: Not on file  . Years of education: Not on file  . Highest education level: Not on file  Occupational History  . Not on file  Tobacco Use  . Smoking status: Never Smoker  . Smokeless tobacco: Never Used  Substance and Sexual Activity  . Alcohol use: No    Alcohol/week: 0.0 standard drinks  . Drug use: No  . Sexual activity: Not on file  Other Topics Concern  . Not on file  Social History Narrative  . Not on file   Social Determinants of Health   Financial Resource Strain:   . Difficulty of Paying Living Expenses:   Food Insecurity:   . Worried About Programme researcher, broadcasting/film/video in the Last Year:   . Barista in the Last Year:   Transportation Needs:   . Freight forwarder (Medical):   Marland Kitchen Lack of Transportation (Non-Medical):   Physical Activity:   . Days of Exercise per Week:   . Minutes of Exercise per Session:   Stress:   . Feeling of Stress :   Social Connections:   . Frequency of Communication with Friends and Family:   . Frequency of Social Gatherings with Friends and Family:   . Attends Religious Services:   . Active Member of Clubs or Organizations:   . Attends Banker Meetings:   Marland Kitchen Marital Status:   Intimate Partner Violence:   . Fear  of Current or Ex-Partner:   . Emotionally Abused:   Marland Kitchen Physically Abused:   . Sexually Abused:    Family History  Problem Relation Age of Onset  . Aneurysm Maternal Grandmother   . Diabetes Maternal Grandfather   . CVA Paternal Grandmother   . Lung cancer Paternal Grandfather   . Hypertension Mother   . Hypertension Father   . Urolithiasis Father    No Known Allergies     Medications: Outpatient Medications Prior to Visit  Medication Sig  . hydrochlorothiazide (HYDRODIURIL) 25 MG tablet TAKE 1 TABLET BY MOUTH EVERY DAY  . meloxicam (MOBIC) 15 MG tablet Take 15 mg by mouth daily.  . hydrOXYzine (VISTARIL) 25 MG capsule Take 1 capsule (25 mg total) by mouth 3 (three) times daily as needed for anxiety. (Patient not taking: Reported on 09/18/2019)  . sertraline (ZOLOFT) 50 MG tablet Take 1 tablet (50 mg total) by mouth daily. (Patient not taking: Reported on 09/18/2019)  . [DISCONTINUED] acetaminophen (TYLENOL) 500 MG tablet Take 500 mg by mouth every 6 (six) hours as needed (pt takes two tablets as needed).  . [DISCONTINUED] potassium chloride SA (K-DUR,KLOR-CON) 20 MEQ tablet Take 1 tablet (20 mEq total) by mouth daily. (Patient not taking: Reported on 09/18/2019)   No  facility-administered medications prior to visit.    Review of Systems  Constitutional: Negative.   Respiratory: Negative.   Cardiovascular: Negative.   Musculoskeletal: Negative.         Objective:    BP (!) 130/91 (BP Location: Right Arm, Patient Position: Sitting, Cuff Size: Normal)   Pulse 77   Temp (!) 96.9 F (36.1 C) (Temporal)   Wt 177 lb (80.3 kg)   BMI 27.72 kg/m    Physical Exam Constitutional:      General: He is not in acute distress.    Appearance: He is well-developed.  HENT:     Head: Normocephalic and atraumatic.     Right Ear: Hearing and tympanic membrane normal.     Left Ear: Hearing and tympanic membrane normal.     Nose: Nose normal.  Eyes:     General: Lids are normal. No  scleral icterus.       Right eye: No discharge.        Left eye: No discharge.     Conjunctiva/sclera: Conjunctivae normal.  Cardiovascular:     Rate and Rhythm: Normal rate.     Heart sounds: Normal heart sounds.  Pulmonary:     Effort: Pulmonary effort is normal. No respiratory distress.     Breath sounds: Normal breath sounds.  Musculoskeletal:        General: Normal range of motion.     Cervical back: Neck supple.  Skin:    Findings: No lesion or rash.  Neurological:     Mental Status: He is alert and oriented to person, place, and time.  Psychiatric:        Speech: Speech normal.        Behavior: Behavior normal.        Thought Content: Thought content normal.       Assessment & Plan:    1. History of COVID-19 Symptoms of fatigue, cough, chest pains, loss of taste and dyspnea with positive COVID test on 08-28-19. Was feeling better in 2 weeks. Employer required 2 negative COVID tests to return to work. Test on 09-13-19 was negative but test on 09-16-19 was inconclusive. Dyspnea, chest discomfort and loss of taste persisted and was rechecked in the ER on 09-23-19 due to chest pains. With history of COVID-19 infection, he was advised to maintain isolation for 10 more days. Repeat COVID tests on 10-03-19 and 10-05-19 were both negative and he returned to work on 10-07-19 with resolution of cough, dyspnea and chest pains. Energy level has improved but loss of taste has persisted. Completed disability and FMLA medial certification forms. He should wait a couple months before getting the COVID vaccination. Recommend he get copies of his COVID test results scanned into this chart and to go with his FMLA paperwork. Recheck prn.      Vernie Murders, [place] [phone] (phone) [phone] (fax)  [place]

## 2019-10-29 DIAGNOSIS — Z113 Encounter for screening for infections with a predominantly sexual mode of transmission: Secondary | ICD-10-CM | POA: Diagnosis not present

## 2019-10-29 DIAGNOSIS — A6000 Herpesviral infection of urogenital system, unspecified: Secondary | ICD-10-CM | POA: Diagnosis not present

## 2019-10-31 ENCOUNTER — Telehealth: Payer: Self-pay

## 2019-10-31 NOTE — Telephone Encounter (Signed)
[employer] FMLA/Disability Forms [id number] were faxed 10/29/2019 @ 11:50 am by [facility]. Thanks TNP

## 2019-11-19 IMAGING — CR DG CHEST 2V
2 series · 2 of 2 positions shown · non-contrast
Comparison: None.

CLINICAL DATA: Chest pain

EXAM:
CHEST - 2 VIEW

[chest pa]
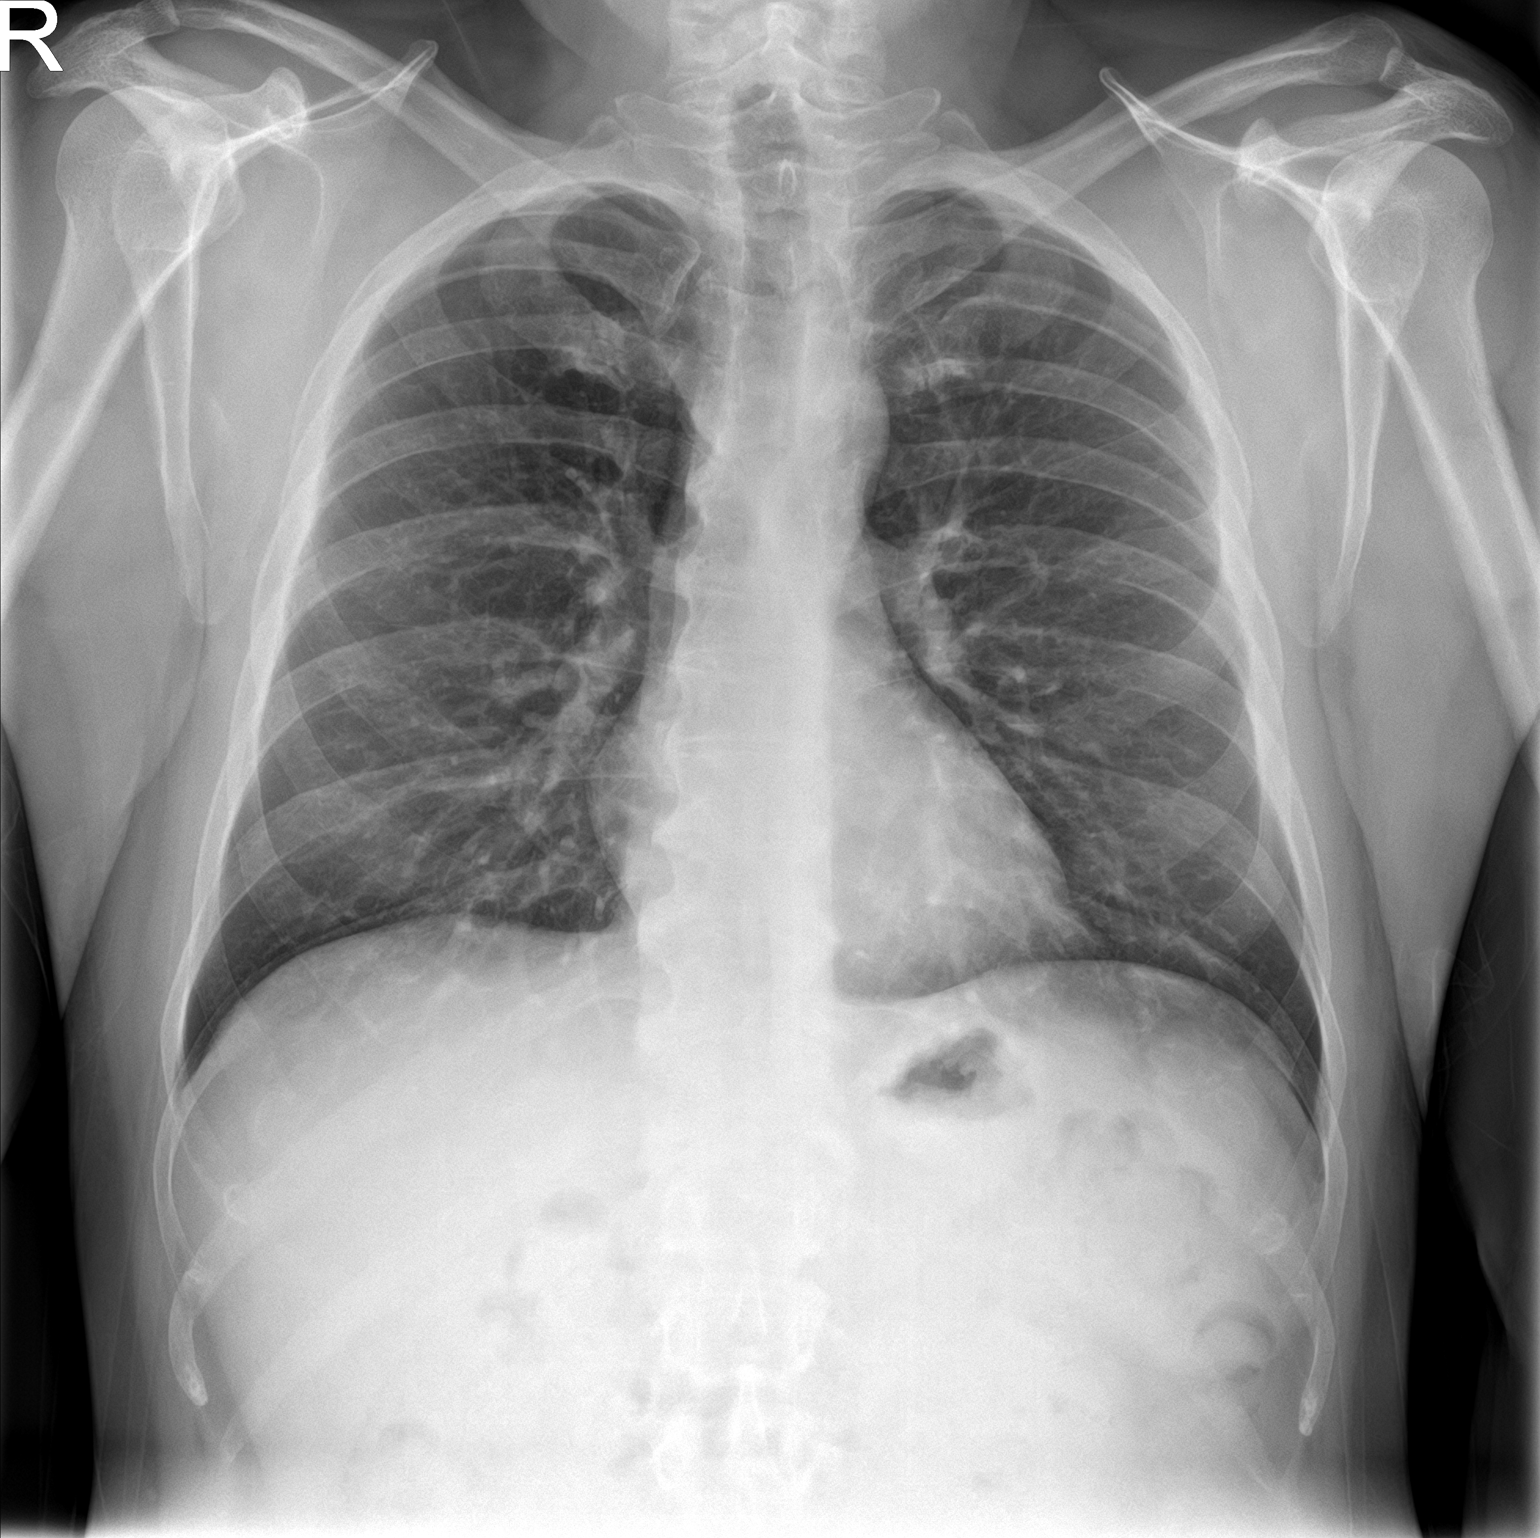

[chest lat]
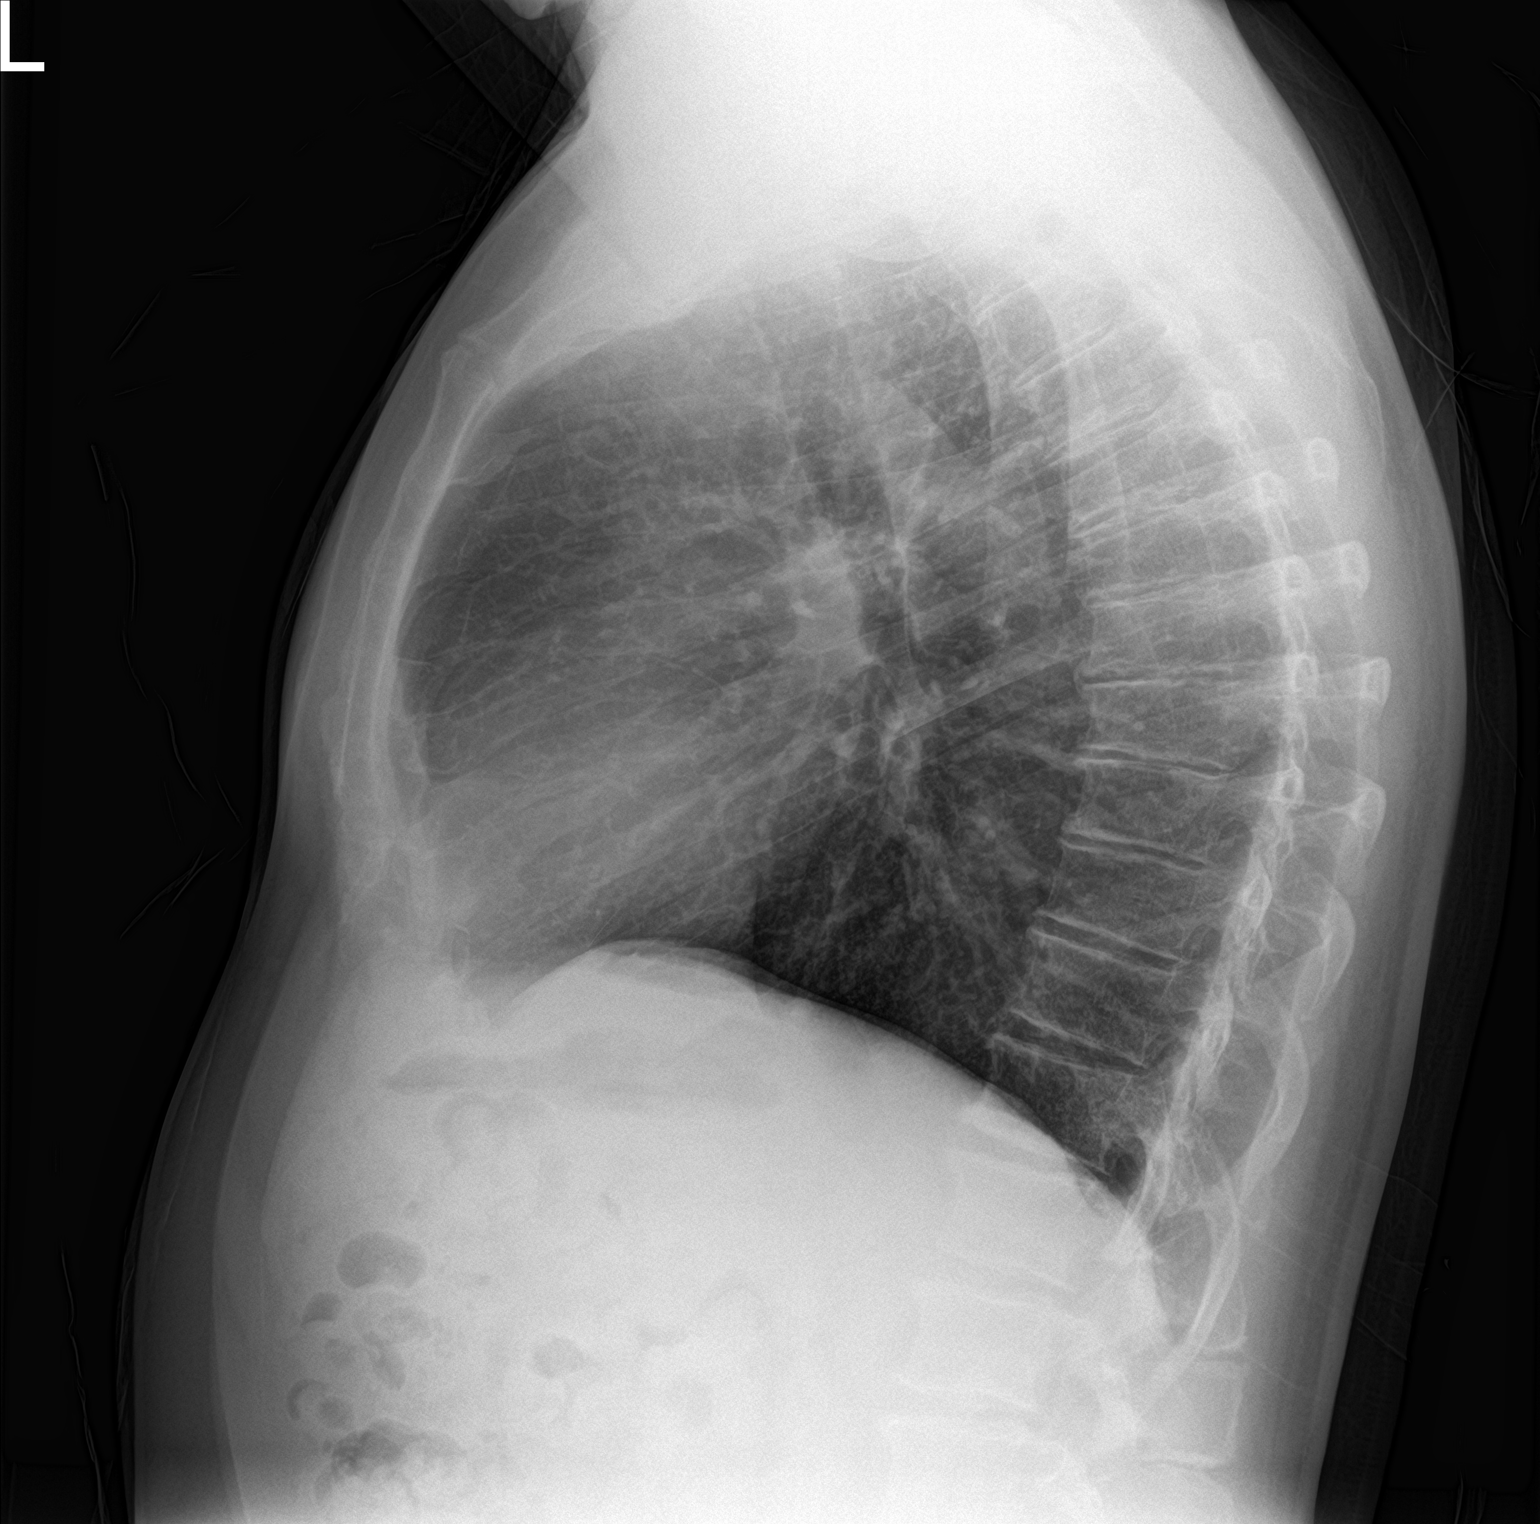

[2 of 2 positions shown; findings below may reference images not displayed]

FINDINGS: The heart size and mediastinal contours are within normal limits.
Both lungs are clear. Mild degenerative changes of the spine.
IMPRESSION: No active cardiopulmonary disease.

## 2019-12-02 ENCOUNTER — Emergency Department: Payer: BC Managed Care – PPO

## 2019-12-02 ENCOUNTER — Observation Stay
Admission: EM | Admit: 2019-12-02 | Discharge: 2019-12-03 | Disposition: A | Discharge status: Home / Self Care | Payer: BC Managed Care – PPO | Attending: Internal Medicine | Admitting: Internal Medicine

## 2019-12-02 ENCOUNTER — Encounter: Payer: Self-pay | Admitting: Emergency Medicine

## 2019-12-02 ENCOUNTER — Other Ambulatory Visit: Payer: Self-pay

## 2019-12-02 DIAGNOSIS — R079 Chest pain, unspecified: Principal | ICD-10-CM | POA: Diagnosis present

## 2019-12-02 DIAGNOSIS — E876 Hypokalemia: Secondary | ICD-10-CM | POA: Diagnosis present

## 2019-12-02 DIAGNOSIS — Z8616 Personal history of COVID-19: Secondary | ICD-10-CM | POA: Diagnosis not present

## 2019-12-02 DIAGNOSIS — R7989 Other specified abnormal findings of blood chemistry: Secondary | ICD-10-CM | POA: Diagnosis not present

## 2019-12-02 DIAGNOSIS — I10 Essential (primary) hypertension: Secondary | ICD-10-CM | POA: Diagnosis present

## 2019-12-02 DIAGNOSIS — Z8249 Family history of ischemic heart disease and other diseases of the circulatory system: Secondary | ICD-10-CM | POA: Diagnosis not present

## 2019-12-02 DIAGNOSIS — Z791 Long term (current) use of non-steroidal anti-inflammatories (NSAID): Secondary | ICD-10-CM | POA: Insufficient documentation

## 2019-12-02 DIAGNOSIS — R0789 Other chest pain: Secondary | ICD-10-CM | POA: Diagnosis not present

## 2019-12-02 DIAGNOSIS — Z79899 Other long term (current) drug therapy: Secondary | ICD-10-CM | POA: Insufficient documentation

## 2019-12-02 DIAGNOSIS — F411 Generalized anxiety disorder: Secondary | ICD-10-CM | POA: Insufficient documentation

## 2019-12-02 DIAGNOSIS — R778 Other specified abnormalities of plasma proteins: Secondary | ICD-10-CM

## 2019-12-02 DIAGNOSIS — Z20822 Contact with and (suspected) exposure to covid-19: Secondary | ICD-10-CM | POA: Diagnosis not present

## 2019-12-02 LAB — BASIC METABOLIC PANEL WITH GFR
Anion gap: 9 (ref 5–15)
BUN: 17 mg/dL (ref 6–20)
CO2: 26 mmol/L (ref 22–32)
Calcium: 9.4 mg/dL (ref 8.9–10.3)
Chloride: 104 mmol/L (ref 98–111)
Creatinine, Ser: 1.13 mg/dL (ref 0.61–1.24)
GFR calc Af Amer: >60 mL/min
GFR calc non Af Amer: >60 mL/min
Glucose, Bld: 107 mg/dL — ABNORMAL HIGH (ref 70–99)
Potassium: 3.4 mmol/L — ABNORMAL LOW (ref 3.5–5.1)
Sodium: 139 mmol/L (ref 135–145)

## 2019-12-02 LAB — CBC
HCT: 45.8 % (ref 39.0–52.0)
Hemoglobin: 16.3 g/dL (ref 13.0–17.0)
MCH: 30 pg (ref 26.0–34.0)
MCHC: 35.6 g/dL (ref 30.0–36.0)
MCV: 84.2 fL (ref 80.0–100.0)
Platelets: 164 K/uL (ref 150–400)
RBC: 5.44 MIL/uL (ref 4.22–5.81)
RDW: 13.4 % (ref 11.5–15.5)
WBC: 5.6 K/uL (ref 4.0–10.5)
nRBC: 0 % (ref 0.0–0.2)

## 2019-12-02 LAB — TROPONIN I (HIGH SENSITIVITY)
Troponin I (High Sensitivity): 104 ng/L (ref <18)
Troponin I (High Sensitivity): 103 ng/L

## 2019-12-02 MED ORDER — SODIUM CHLORIDE 0.9 % IV SOLN: INTRAVENOUS | Status: DC

## 2019-12-02 MED ORDER — ENOXAPARIN SODIUM 40 MG/0.4ML ~~LOC~~ SOLN: 40.0000 mg | SUBCUTANEOUS | Status: DC

## 2019-12-02 MED ORDER — SODIUM CHLORIDE 0.9% FLUSH
3.0000 mL | Freq: Once | INTRAVENOUS | Status: AC
  Administered 2019-12-03: 3 mL via INTRAVENOUS

## 2019-12-02 MED ORDER — ONDANSETRON HCL 4 MG/2ML IJ SOLN: 4.0000 mg | Freq: Four times a day (QID) | INTRAMUSCULAR | Status: DC | PRN

## 2019-12-02 MED ORDER — ACETAMINOPHEN 325 MG PO TABS: 650.0000 mg | ORAL_TABLET | ORAL | Status: DC | PRN

## 2019-12-02 MED ORDER — ASPIRIN 81 MG PO CHEW
324.0000 mg | CHEWABLE_TABLET | Freq: Once | ORAL | Status: AC
  Administered 2019-12-02: 324 mg via ORAL
  Filled 2019-12-02: qty 4

## 2019-12-02 MED ORDER — NITROGLYCERIN 0.4 MG SL SUBL
0.4000 mg | SUBLINGUAL_TABLET | SUBLINGUAL | Status: DC | PRN
  Administered 2019-12-02: 0.4 mg via SUBLINGUAL
  Filled 2019-12-02: qty 1

## 2019-12-02 MED ORDER — METOPROLOL TARTRATE 25 MG PO TABS
25.0000 mg | ORAL_TABLET | Freq: Two times a day (BID) | ORAL | Status: DC
  Administered 2019-12-03 (×2): 25 mg via ORAL
  Filled 2019-12-02 (×2): qty 1

## 2019-12-02 NOTE — ED Triage Notes (Signed)
Pt to ED from home c/o mid chest pain yesterday that has become more constant today radiating to left and right shoulders, denies back pain, denies n/v/d, denies SOB.  Patient A&Ox4, chest rise even and unlabored, skin WNL, in NAD at this time.

## 2019-12-02 NOTE — H&P (Addendum)
History and Physical   Russell Simon:[id number] DOB: 03-18-1971 DOA: 12/02/2019  Referring MD/NP/PA: Dr. Derrill Kay  PCP: Tamsen Roers, PA   Outpatient Specialists: None  Patient coming from: Home  Chief Complaint: Chest pain  HPI: Russell Simon is a 49 y.o. male with medical history significant of hypertension, anxiety disorder, family history of early coronary artery disease, recent Covid pneumonia in February of this year, who presented to the ER with sudden onset of chest pain.  Pain was heavy and sharp in the middle of his chest radiating to both shoulders and arms.  It started insidiously since yesterday.  It gets worse in the ER and rated as 8 out of 10 in the beginning.  Currently relieved by nitroglycerin.  Has not had this kind of pain before.  Patient denied any diaphoresis.  Denied any cough or shortness of breath.  His evaluation in the ER showed mild elevation of troponin but normal EKG.  Patient is therefore being admitted for rule out MI.  He has had previous panic attacks but not like this.  He denies any tuberculosis use.  His main risk factors are hypertension male sex and family history.  No hyperlipidemia.  ED Course: Blood pressure is 150/91 otherwise vitals were stable.  CBC and chemistry are all within normal except for potassium 3.4 and glucose of 107.  Initial troponin was 104-second set 103.  Chest x-ray showed no acute findings.  EKG showed no acute findings.  Review of Systems: As per HPI otherwise 10 point review of systems negative.    Past Medical History:  Diagnosis Date  . Hypertension     Past Surgical History:  Procedure Laterality Date  . ADENOIDECTOMY    . TONSILLECTOMY AND ADENOIDECTOMY  1983     reports that he has never smoked. He has never used smokeless tobacco. He reports that he does not drink alcohol or use drugs.  No Known Allergies  Family History  Problem Relation Age of Onset  . Aneurysm Maternal Grandmother   .  Diabetes Maternal Grandfather   . CVA Paternal Grandmother   . Lung cancer Paternal Grandfather   . Hypertension Mother   . Hypertension Father   . Urolithiasis Father      Prior to Admission medications   Medication Sig Start Date End Date Taking? Authorizing Provider  hydrochlorothiazide (HYDRODIURIL) 25 MG tablet TAKE 1 TABLET BY MOUTH EVERY DAY 05/24/19  Yes Chrismon, Jodell Cipro, PA  meloxicam (MOBIC) 15 MG tablet Take 15 mg by mouth daily. 10/10/19  Yes [provider]  hydrOXYzine (VISTARIL) 25 MG capsule Take 1 capsule (25 mg total) by mouth 3 (three) times daily as needed for anxiety. Patient not taking: Reported on 09/18/2019 09/18/18   Chrismon, Jodell Cipro, PA  sertraline (ZOLOFT) 50 MG tablet Take 1 tablet (50 mg total) by mouth daily. Patient not taking: Reported on 09/18/2019 09/18/18   Tamsen Roers, [place]    Physical Exam: Vitals:   12/02/19 2004  BP: (!) 151/91  Pulse: 83  Resp: 16  Temp: 98.6 F (37 C)  TempSrc: Oral  SpO2: 98%  Weight: 79.4 kg  Height: 5\' 7"  (1.702 m)      Constitutional: Anxious, no acute distress Vitals:   12/02/19 2004  BP: (!) 151/91  Pulse: 83  Resp: 16  Temp: 98.6 F (37 C)  TempSrc: Oral  SpO2: 98%  Weight: 79.4 kg  Height: 5\' 7"  (1.702 m)   Eyes: PERRL, lids and conjunctivae  normal ENMT: Mucous membranes are moist. Posterior pharynx clear of any exudate or lesions.Normal dentition.  Neck: normal, supple, no masses, no thyromegaly Respiratory: clear to auscultation bilaterally, no wheezing, no crackles. Normal respiratory effort. No accessory muscle use.  Cardiovascular: Regular rate and rhythm, no murmurs / rubs / gallops. No extremity edema. 2+ pedal pulses. No carotid bruits.  Abdomen: no tenderness, no masses palpated. No hepatosplenomegaly. Bowel sounds positive.  Musculoskeletal: no clubbing / cyanosis. No joint deformity upper and lower extremities. Good ROM, no contractures. Normal muscle tone.  Skin: no  rashes, lesions, ulcers. No induration Neurologic: CN 2-12 grossly intact. Sensation intact, DTR normal. Strength 5/5 in all 4.  Psychiatric: Normal judgment and insight. Alert and oriented x 3.  Anxious mood.     Labs on Admission: I have personally reviewed following labs and imaging studies  CBC: Recent Labs  Lab 12/02/19 2012  WBC 5.6  HGB 16.3  HCT 45.8  MCV 84.2  PLT 517   Basic Metabolic Panel: Recent Labs  Lab 12/02/19 2012  NA 139  K 3.4*  CL 104  CO2 26  GLUCOSE 107*  BUN 17  CREATININE 1.13  CALCIUM 9.4   GFR: Estimated Creatinine Clearance: 81.6 mL/min (by C-G formula based on SCr of 1.13 mg/dL). Liver Function Tests: No results for input(s): AST, ALT, ALKPHOS, BILITOT, PROT, ALBUMIN in the last 168 hours. No results for input(s): LIPASE, AMYLASE in the last 168 hours. No results for input(s): AMMONIA in the last 168 hours. Coagulation Profile: No results for input(s): INR, PROTIME in the last 168 hours. Cardiac Enzymes: No results for input(s): CKTOTAL, CKMB, CKMBINDEX, TROPONINI in the last 168 hours. BNP (last 3 results) No results for input(s): PROBNP in the last 8760 hours. HbA1C: No results for input(s): HGBA1C in the last 72 hours. CBG: No results for input(s): GLUCAP in the last 168 hours. Lipid Profile: No results for input(s): CHOL, HDL, LDLCALC, TRIG, CHOLHDL, LDLDIRECT in the last 72 hours. Thyroid Function Tests: No results for input(s): TSH, T4TOTAL, FREET4, T3FREE, THYROIDAB in the last 72 hours. Anemia Panel: No results for input(s): VITAMINB12, FOLATE, FERRITIN, TIBC, IRON, RETICCTPCT in the last 72 hours. Urine analysis: No results found for: COLORURINE, APPEARANCEUR, LABSPEC, PHURINE, GLUCOSEU, HGBUR, BILIRUBINUR, KETONESUR, PROTEINUR, UROBILINOGEN, NITRITE, LEUKOCYTESUR Sepsis Labs: @LABRCNTIP (procalcitonin:4,lacticidven:4) )No results found for this or any previous visit (from the past 240 hour(s)).   Radiological Exams on  Admission: DG Chest 2 View  Result Date: 12/02/2019 CLINICAL DATA:  Chest pain for 2 days EXAM: CHEST - 2 VIEW COMPARISON:  09/23/2019 FINDINGS: Cardiac shadow is within normal limits. The lungs are well aerated bilaterally. No focal infiltrate or sizable effusion is seen. No bony abnormality is noted. IMPRESSION: No acute abnormality noted. Electronically Signed   By: Inez Catalina M.D.   On: 12/02/2019 20:28    EKG: Independently reviewed.  It shows sinus rhythm with a rate of 72.  Normal intervals.  No significant ST changes.  Assessment/Plan Principal Problem:   Chest pain Active Problems:   Essential hypertension   Hypokalemia     #1 chest pain with mild elevation of troponin: Patient will be admitted and complete cycling his enzymes.  We will get an echocardiogram in the morning.  May consider inpatient or outpatient cardiac stress test based on his family history and mild elevation of troponin.  Patient also reported recent COVID-19 pneumonia.  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  Continue nitroglycerin as needed.  Beta-blockers added.  Aspirin daily  #  2 hypertension: Patient is on hydrochlorothiazide.  Beta-blockers added.  Continue to monitor blood pressure.  #3 hypokalemia: Most likely related to the use of hydrochlorothiazide.  Will replete potassium  #4 anxiety status: Not on any anxiolytics.  Consider benzos if continues to be anxious.   DVT prophylaxis: Lovenox Code Status: Full code Family Communication: No family at bedside Disposition Plan: Home Consults called: None Admission status: Observation  Severity of Illness: The appropriate patient status for this patient is OBSERVATION. Observation status is judged to be reasonable and necessary in order to provide the required intensity of service to ensure the patient's safety. The patient's presenting symptoms, physical exam findings, and initial radiographic and laboratory data in the context of their medical  condition is felt to place them at decreased risk for further clinical deterioration. Furthermore, it is anticipated that the patient will be medically stable for discharge from the hospital within 2 midnights of admission. The following factors support the patient status of observation.   " The patient's presenting symptoms include chest pain. " The physical exam findings include anxious otherwise no significant findings. " The initial radiographic and laboratory data are troponin I 04.     Lonia Blood MD [facility] Pager [phone]  If 7PM-7AM, please contact night-coverage [email or web address] Password [facility]  12/02/2019, 11:26 PM

## 2019-12-02 NOTE — ED Provider Notes (Signed)
[facility] Emergency Department Provider Note   ____________________________________________   I have reviewed the triage vital signs and the nursing notes.   HISTORY  Chief Complaint Chest Pain   History limited by: Not Limited   HPI Russell TOLSON is a 49 y.o. male who presents to the emergency department today because of concerns for chest pain.  He states it is located in the center part of his chest with some radiation up to his left shoulder.  He describes it as pressure.  Started yesterday however was initially intermittent.  Today however the pain has become more constant.  He says that he has had chest pain in the past with panic attacks although this does not remind him of a panic attack.  He denies any associated shortness of breath.  He denies any history of heart disease although states his father has had issues with his heart.   Records reviewed. Per medical record review patient has a history of HTN  Past Medical History:  Diagnosis Date  . Hypertension     Patient Active Problem List   Diagnosis Date Noted  . Generalized anxiety disorder 10/15/2018  . Essential hypertension 04/03/2017  . Low back pain 03/04/2015    Past Surgical History:  Procedure Laterality Date  . ADENOIDECTOMY    . TONSILLECTOMY AND ADENOIDECTOMY  1983    Prior to Admission medications   Medication Sig Start Date End Date Taking? Authorizing Provider  hydrochlorothiazide (HYDRODIURIL) 25 MG tablet TAKE 1 TABLET BY MOUTH EVERY DAY 05/24/19   Chrismon, Vickki Muff, PA  hydrOXYzine (VISTARIL) 25 MG capsule Take 1 capsule (25 mg total) by mouth 3 (three) times daily as needed for anxiety. Patient not taking: Reported on 09/18/2019 09/18/18   Chrismon, Vickki Muff, PA  meloxicam (MOBIC) 15 MG tablet Take 15 mg by mouth daily. 10/10/19   [provider]  sertraline (ZOLOFT) 50 MG tablet Take 1 tablet (50 mg total) by mouth daily. Patient not taking: Reported on  09/18/2019 09/18/18   Chrismon, Vickki Muff, PA    Allergies Patient has no known allergies.  Family History  Problem Relation Age of Onset  . Aneurysm Maternal Grandmother   . Diabetes Maternal Grandfather   . CVA Paternal Grandmother   . Lung cancer Paternal Grandfather   . Hypertension Mother   . Hypertension Father   . Urolithiasis Father     Social History Social History   Tobacco Use  . Smoking status: Never Smoker  . Smokeless tobacco: Never Used  Substance Use Topics  . Alcohol use: No    Alcohol/week: 0.0 standard drinks  . Drug use: No    Review of Systems Constitutional: No fever/chills Eyes: No visual changes. ENT: No sore throat. Cardiovascular: Positive chest pain. Respiratory: Denies shortness of breath. Gastrointestinal: No abdominal pain.  No nausea, no vomiting.  No diarrhea.   Genitourinary: Negative for dysuria. Musculoskeletal: Negative for back pain. Skin: Negative for rash. Neurological: Negative for headaches, focal weakness or numbness.  ____________________________________________   PHYSICAL EXAM:  VITAL SIGNS: ED Triage Vitals [12/02/19 2004]  Enc Vitals Group     BP (!) 151/91     Pulse Rate 83     Resp 16     Temp 98.6 F (37 C)     Temp Source Oral     SpO2 98 %     Weight 175 lb (79.4 kg)     Height 5\' 7"  (1.702 m)     Head  Circumference      Peak Flow      Pain Score 6   Constitutional: Alert and oriented.  Eyes: Conjunctivae are normal.  ENT      Head: Normocephalic and atraumatic.      Nose: No congestion/rhinnorhea.      Mouth/Throat: Mucous membranes are moist.      Neck: No stridor. Hematological/Lymphatic/Immunilogical: No cervical lymphadenopathy. Cardiovascular: Normal rate, regular rhythm.  No murmurs, rubs, or gallops.  Respiratory: Normal respiratory effort without tachypnea nor retractions. Breath sounds are clear and equal bilaterally. No wheezes/rales/rhonchi. Gastrointestinal: Soft and non tender. No  rebound. No guarding.  Genitourinary: Deferred Musculoskeletal: Normal range of motion in all extremities. No lower extremity edema. Neurologic:  Normal speech and language. No gross focal neurologic deficits are appreciated.  Skin:  Skin is warm, dry and intact. No rash noted. Psychiatric: Mood and affect are normal. Speech and behavior are normal. Patient exhibits appropriate insight and judgment.  ____________________________________________    LABS (pertinent positives/negatives)  Trop hs 104 CBC wbc 5.6, hgb 16.3, plt 164 BMP wnl except k 3.4, glu 107  ____________________________________________   EKG  I, Phineas Semen, attending physician, personally viewed and interpreted this EKG  EKG Time: 1959 Rate: 72 Rhythm: normal sinus rhythm Axis: normal Intervals: qtc 405 QRS: narrow ST changes: no st elevation Impression: abnormal ekg   ____________________________________________    RADIOLOGY  CXR No acute abnormality  ____________________________________________   PROCEDURES  Procedures  ____________________________________________   INITIAL IMPRESSION / ASSESSMENT AND PLAN / ED COURSE  Pertinent labs & imaging results that were available during my care of the patient were reviewed by me and considered in my medical decision making (see chart for details).   Patient presented to the ED because of concern for chest pain. EKG without ST elevation. Initial trop elevated. Patient was given aspirin and a nitro which he states helped his pain. Will plan on admission. Discussed findings and plan with patient.   ____________________________________________   FINAL CLINICAL IMPRESSION(S) / ED DIAGNOSES  Final diagnoses:  Chest pain, unspecified type  Elevated troponin     Note: This dictation was prepared with Dragon dictation. Any transcriptional errors that result from this process are unintentional     Phineas Semen, MD 12/03/19 1525

## 2019-12-03 ENCOUNTER — Observation Stay
Admit: 2019-12-03 | Discharge: 2019-12-03 | Disposition: A | Discharge status: Home / Self Care | Payer: BC Managed Care – PPO | Attending: Internal Medicine | Admitting: Internal Medicine

## 2019-12-03 ENCOUNTER — Encounter: Payer: Self-pay | Admitting: Internal Medicine

## 2019-12-03 ENCOUNTER — Observation Stay: Payer: BC Managed Care – PPO

## 2019-12-03 ENCOUNTER — Other Ambulatory Visit: Payer: Self-pay

## 2019-12-03 DIAGNOSIS — R079 Chest pain, unspecified: Secondary | ICD-10-CM | POA: Diagnosis not present

## 2019-12-03 DIAGNOSIS — I10 Essential (primary) hypertension: Secondary | ICD-10-CM | POA: Diagnosis not present

## 2019-12-03 DIAGNOSIS — R0789 Other chest pain: Secondary | ICD-10-CM | POA: Diagnosis not present

## 2019-12-03 LAB — NM MYOCAR MULTI W/SPECT W/WALL MOTION / EF
Estimated workload: 1 METS
Exercise duration (min): 1 min
Exercise duration (sec): 13 s
LV dias vol: 67 mL (ref 62–150)
LV sys vol: 25 mL
Peak HR: 123 {beats}/min
Percent HR: 71 %
Rest HR: 81 {beats}/min
SDS: 0
SRS: 0
SSS: 0
TID: 1.09

## 2019-12-03 LAB — PROTIME-INR
INR: 1 (ref 0.8–1.2)
Prothrombin Time: 13.1 seconds (ref 11.4–15.2)

## 2019-12-03 LAB — LIPID PANEL
Cholesterol: 153 mg/dL (ref 0–200)
HDL: 37 mg/dL — ABNORMAL LOW (ref >40)
LDL Cholesterol: 84 mg/dL (ref 0–99)
Total CHOL/HDL Ratio: 4.1 RATIO
Triglycerides: 158 mg/dL — ABNORMAL HIGH (ref <150)
VLDL: 32 mg/dL (ref 0–40)

## 2019-12-03 LAB — ECHOCARDIOGRAM COMPLETE
Height: 67 in
Weight: 2800 oz

## 2019-12-03 LAB — TROPONIN I (HIGH SENSITIVITY)
Troponin I (High Sensitivity): 221 ng/L (ref <18)
Troponin I (High Sensitivity): 425 ng/L (ref <18)
Troponin I (High Sensitivity): 532 ng/L (ref <18)
Troponin I (High Sensitivity): 621 ng/L (ref <18)
Troponin I (High Sensitivity): 659 ng/L (ref <18)
Troponin I (High Sensitivity): 549 ng/L (ref <18)

## 2019-12-03 LAB — APTT: aPTT: 30 seconds (ref 24–36)

## 2019-12-03 LAB — SARS CORONAVIRUS 2 BY RT PCR (HOSPITAL ORDER, PERFORMED IN ~~LOC~~ HOSPITAL LAB): SARS Coronavirus 2: NEGATIVE

## 2019-12-03 MED ORDER — NITROGLYCERIN 0.4 MG SL SUBL: 0.4000 mg | SUBLINGUAL_TABLET | SUBLINGUAL | 12 refills | Status: AC | PRN

## 2019-12-03 MED ORDER — MELOXICAM 7.5 MG PO TABS
15.0000 mg | ORAL_TABLET | Freq: Every day | ORAL | Status: DC
  Administered 2019-12-03: 12:00:00 15 mg via ORAL
  Filled 2019-12-03: qty 2

## 2019-12-03 MED ORDER — HEPARIN (PORCINE) 25000 UT/250ML-% IV SOLN
1000.0000 [IU]/h | INTRAVENOUS | Status: DC
  Administered 2019-12-03: 08:00:00 1000 [IU]/h via INTRAVENOUS
  Filled 2019-12-03: qty 250

## 2019-12-03 MED ORDER — HEPARIN BOLUS VIA INFUSION
4000.0000 [IU] | Freq: Once | INTRAVENOUS | Status: AC
  Administered 2019-12-03: 08:00:00 4000 [IU] via INTRAVENOUS
  Filled 2019-12-03: qty 4000

## 2019-12-03 MED ORDER — ALPRAZOLAM 0.5 MG PO TABS
0.2500 mg | ORAL_TABLET | Freq: Three times a day (TID) | ORAL | Status: DC | PRN
  Administered 2019-12-03: 08:00:00 0.25 mg via ORAL
  Filled 2019-12-03: qty 1

## 2019-12-03 MED ORDER — ASPIRIN EC 81 MG PO TBEC
81.0000 mg | DELAYED_RELEASE_TABLET | Freq: Every day | ORAL | Status: DC
  Administered 2019-12-03: 12:00:00 81 mg via ORAL
  Filled 2019-12-03: qty 1

## 2019-12-03 MED ORDER — REGADENOSON 0.4 MG/5ML IV SOLN
0.4000 mg | Freq: Once | INTRAVENOUS | Status: AC
  Administered 2019-12-03: 11:00:00 0.4 mg via INTRAVENOUS
  Filled 2019-12-03: qty 5

## 2019-12-03 MED ORDER — TECHNETIUM TC 99M TETROFOSMIN IV KIT
30.0000 | PACK | Freq: Once | INTRAVENOUS | Status: AC | PRN
  Administered 2019-12-03: 30.308 via INTRAVENOUS

## 2019-12-03 MED ORDER — TECHNETIUM TC 99M TETROFOSMIN IV KIT
10.0000 | PACK | Freq: Once | INTRAVENOUS | Status: AC | PRN
  Administered 2019-12-03: 09:00:00 10.202 via INTRAVENOUS

## 2019-12-03 MED ORDER — HYDROCHLOROTHIAZIDE 25 MG PO TABS
25.0000 mg | ORAL_TABLET | Freq: Every day | ORAL | Status: DC
  Administered 2019-12-03: 12:00:00 25 mg via ORAL
  Filled 2019-12-03: qty 1

## 2019-12-03 NOTE — ED Notes (Signed)
Manuela Schwartz messaged to inform as a reminder pt is not on heparin. Vikki Ports, RN notified that pt is not on heparin either.

## 2019-12-03 NOTE — ED Notes (Signed)
Taken to nuclear med with nuc med tech. Ok to go without Charity fundraiser, verified with Consulting civil engineer greg.

## 2019-12-03 NOTE — ED Notes (Signed)
Provider has been notified of elevated troponin. Russell Simon states "we will continue to trend till it peaks."

## 2019-12-03 NOTE — Consult Note (Signed)
CARDIOLOGY CONSULT NOTE               Patient ID: Russell Simon MRN: [id number] DOB/AGE: 1970/10/23 49 y.o.  Admit date: 12/02/2019 Referring Physician dr. Lolita Patella  Primary Physician Dortha Kern, PA-C Primary Cardiologist n/a Reason for Consultation Chest pain, elevated troponin   HPI: Mr. Russell Simon is a 49 year old male with a past medical history significant for hypertension, anxiety, and COVID-19 in February who presented to the ED on 12/02/19 for a two day history of mid-sternal chest pain radiating to the left shoulder.  Pain started Sunday morning and remained fairly constant until nitroglycerin was given in the ED.  He denies associated shortness of breath or diaphoresis.  He is currently chest pain free.  Workup thus far has included high sensitivity troponin elevated x 7 at 104, 103, 221, 425, 532, 621, and 659 respectively, ECG negative for ST changes or T wave abnormalities, and chest xray negative for acute cardiopulmonary abnormalities.    Review of systems complete and found to be negative unless listed above     Past Medical History:  Diagnosis Date  . Hypertension     Past Surgical History:  Procedure Laterality Date  . ADENOIDECTOMY    . TONSILLECTOMY AND ADENOIDECTOMY  1983    (Not in a hospital admission)  Social History   Socioeconomic History  . Marital status: Single    Spouse name: Not on file  . Number of children: Not on file  . Years of education: Not on file  . Highest education level: Not on file  Occupational History  . Not on file  Tobacco Use  . Smoking status: Never Smoker  . Smokeless tobacco: Never Used  Substance and Sexual Activity  . Alcohol use: No    Alcohol/week: 0.0 standard drinks  . Drug use: No  . Sexual activity: Not on file  Other Topics Concern  . Not on file  Social History Narrative  . Not on file   Social Determinants of Health   Financial Resource Strain:   . Difficulty of Paying Living  Expenses:   Food Insecurity:   . Worried About Programme researcher, broadcasting/film/video in the Last Year:   . Barista in the Last Year:   Transportation Needs:   . Freight forwarder (Medical):   Marland Kitchen Lack of Transportation (Non-Medical):   Physical Activity:   . Days of Exercise per Week:   . Minutes of Exercise per Session:   Stress:   . Feeling of Stress :   Social Connections:   . Frequency of Communication with Friends and Family:   . Frequency of Social Gatherings with Friends and Family:   . Attends Religious Services:   . Active Member of Clubs or Organizations:   . Attends Banker Meetings:   Marland Kitchen Marital Status:   Intimate Partner Violence:   . Fear of Current or Ex-Partner:   . Emotionally Abused:   Marland Kitchen Physically Abused:   . Sexually Abused:     Family History  Problem Relation Age of Onset  . Aneurysm Maternal Grandmother   . Diabetes Maternal Grandfather   . CVA Paternal Grandmother   . Lung cancer Paternal Grandfather   . Hypertension Mother   . Hypertension Father   . Urolithiasis Father       Review of systems complete and found to be negative unless listed above    PHYSICAL EXAM  General: Well developed, well nourished, in  no acute distress HEENT:  Normocephalic and atramatic Neck:  No JVD.  Lungs: Clear bilaterally to auscultation and percussion. Heart: HRRR . Normal S1 and S2 without gallops or murmurs.  Abdomen: Bowel sounds are positive, abdomen soft and non-tender  Msk:  Back normal. Normal strength and tone for age. Extremities: No clubbing, cyanosis or edema.   Neuro: Alert and oriented X 3. Psych:  Good affect, responds appropriately  Labs:   Lab Results  Component Value Date   WBC 5.6 12/02/2019   HGB 16.3 12/02/2019   HCT 45.8 12/02/2019   MCV 84.2 12/02/2019   PLT 164 12/02/2019    Recent Labs  Lab 12/02/19 2012  NA 139  K 3.4*  CL 104  CO2 26  BUN 17  CREATININE 1.13  CALCIUM 9.4  GLUCOSE 107*   Lab Results   Component Value Date   TROPONINI <0.03 08/04/2018    Lab Results  Component Value Date   CHOL 153 12/03/2019   Lab Results  Component Value Date   HDL 37 (L) 12/03/2019   Lab Results  Component Value Date   LDLCALC 84 12/03/2019   Lab Results  Component Value Date   TRIG 158 (H) 12/03/2019   Lab Results  Component Value Date   CHOLHDL 4.1 12/03/2019   No results found for: LDLDIRECT    Radiology: DG Chest 2 View  Result Date: 12/02/2019 CLINICAL DATA:  Chest pain for 2 days EXAM: CHEST - 2 VIEW COMPARISON:  09/23/2019 FINDINGS: Cardiac shadow is within normal limits. The lungs are well aerated bilaterally. No focal infiltrate or sizable effusion is seen. No bony abnormality is noted. IMPRESSION: No acute abnormality noted. Electronically Signed   By: Inez Catalina M.D.   On: 12/02/2019 20:28    EKG: Normal sinus rhythm with no evidence of significant ST or T wave changes   ASSESSMENT AND PLAN:  1.  Chest pain/elevated troponin   -Patient is currently chest pain free; discussed options of stress test vs. Cardiac catheterization, and patient elects for a stress test  -Will make further recommendations pending stress test results   -Echocardiogram ordered   2.  Hypertension   -Continue home dose of HCTZ 25mg  daily   -Metoprolol added   The history, physical exam findings, and plan of care were all discussed with Dr. Bartholome Bill, and all decision making was made in collaboration.    Signed: Avie Arenas PA-C 12/03/2019, 8:04 AM

## 2019-12-03 NOTE — Progress Notes (Signed)
Stress test revealed no evidence of ischemia.  Echocardiogram revealed normal RV and LV systolic function with an EF estimated between 60-65% with no evidence of wall motion abnormalities; no significant valvular abnormalities.  Would recommend discharge from a cardiology standpoint with follow up at [facility] with Dr. Lady Gary or Marga Hoots, PA-C within 7-10 days of discharge.  Okay to send home with sublingual nitroglycerin.

## 2019-12-03 NOTE — Discharge Instructions (Signed)

## 2019-12-03 NOTE — Discharge Summary (Signed)
Physician Discharge Summary  Russell Simon IRJ:[id number] DOB: Aug 19, 1970 DOA: 12/02/2019  PCP: Tamsen Roers, PA  Admit date: 12/02/2019 Discharge date: 12/03/2019  Admitted From: Home Disposition: Home  Recommendations for Outpatient Follow-up:  1. Follow up with PCP in 1-2 weeks 2. Follow-up with cardiology in 7 to 10 days  Home Health: No Equipment/Devices: None Discharge Condition: Stable CODE STATUS: Full Diet recommendation: Heart Healthy  Brief/Interim Summary: HPI: Russell Simon is a 49 y.o. male with medical history significant of hypertension, anxiety disorder, family history of early coronary artery disease, recent Covid pneumonia in February of this year, who presented to the ER with sudden onset of chest pain.  Pain was heavy and sharp in the middle of his chest radiating to both shoulders and arms.  It started insidiously since yesterday.  It gets worse in the ER and rated as 8 out of 10 in the beginning.  Currently relieved by nitroglycerin.  Has not had this kind of pain before.  Patient denied any diaphoresis.  Denied any cough or shortness of breath.  His evaluation in the ER showed mild elevation of troponin but normal EKG.  Patient is therefore being admitted for rule out MI.  He has had previous panic attacks but not like this.  He denies any tuberculosis use.  His main risk factors are hypertension male sex and family history.  No hyperlipidemia.  5/11: Patient seen and examined.  Discussed with patient that his troponins were uptrending and started him on heparin infusion.  Also consulted cardiology.  Cardiology graciously evaluated patient and after discussion recommended nuclear stress test.  Patient underwent nuclear stress test as well as transthoracic echocardiogram.  Both of reviewed by cardiology and found to be normal.  No evidence of coronary artery disease or cardiomyopathy.  Cleared for discharge from cardiology standpoint.  No active chest pain.   Troponins downtrending.  I discussed with patient again and deemed medically stable for discharge at this time.  Recommend he follow-up with his primary care physician within 1week.  Also recommend he follow-up with Dr. Lady Gary within 7 to 10 days.  Prescription for sublingual nitroglycerin provided at time of discharge.   Discharge Diagnoses:  Principal Problem:   Chest pain Active Problems:   Essential hypertension   Hypokalemia  Chest pain Troponin elevation, suspect demand ischemia Patient had peak in the troponins to 600 Start empirically on heparin infusion Cardiology consulted, recommended stress test Nuclear stress test and echocardiogram performed Both negative, cleared for discharge from cardiology standpoint Discharged home in stable condition Follow-up outpatient PCP 1 week Follow-up cardiology 1 7 to 10 days As needed nitroglycerin prescribed  Hypertension Continue home regimen  Anxiety Not on any meds.  Patient to follow-up with PCP  Discharge Instructions  Discharge Instructions    Diet - low sodium heart healthy   Complete by: As directed    Increase activity slowly   Complete by: As directed      Allergies as of 12/03/2019   No Known Allergies     Medication List    TAKE these medications   hydrochlorothiazide 25 MG tablet Commonly known as: HYDRODIURIL TAKE 1 TABLET BY MOUTH EVERY DAY   hydrOXYzine 25 MG capsule Commonly known as: VISTARIL Take 1 capsule (25 mg total) by mouth 3 (three) times daily as needed for anxiety.   meloxicam 15 MG tablet Commonly known as: MOBIC Take 15 mg by mouth daily.   nitroGLYCERIN 0.4 MG SL tablet Commonly known as: NITROSTAT  Place 1 tablet (0.4 mg total) under the tongue every 5 (five) minutes as needed for chest pain.   sertraline 50 MG tablet Commonly known as: ZOLOFT Take 1 tablet (50 mg total) by mouth daily.      Follow-up Information    Fath, Darlin Priestly, MD. Schedule an appointment as soon as  possible for a visit in 1 week(s).   Specialty: Cardiology Why: Please schedule a follow up with Dr. Harold Hedge or Marga Hoots PA-C within 7-10 days of discharge Contact information: [address] [place] [address] [phone]          No Known Allergies  Consultations:  Cardiology- [facility]   Procedures/Studies: DG Chest 2 View  Result Date: 12/02/2019 CLINICAL DATA:  Chest pain for 2 days EXAM: CHEST - 2 VIEW COMPARISON:  09/23/2019 FINDINGS: Cardiac shadow is within normal limits. The lungs are well aerated bilaterally. No focal infiltrate or sizable effusion is seen. No bony abnormality is noted. IMPRESSION: No acute abnormality noted. Electronically Signed   By: Alcide Clever M.D.   On: 12/02/2019 20:28   NM Myocar Multi W/Spect W/Wall Motion / EF  Result Date: 12/03/2019  The study is normal.  This is a low risk study.  The left ventricular ejection fraction is normal (55-65%).  There was no ST segment deviation noted during stress.  Negative lexiscan stress LV function normal Low risk study with no obvious ischemmia   ECHOCARDIOGRAM COMPLETE  Result Date: 12/03/2019    ECHOCARDIOGRAM REPORT   Patient Name:   Russell Simon Date of Exam: 12/03/2019 Medical Rec #:  [id number]          Height:       67.0 in Accession #:    [id number]         Weight:       175.0 lb Date of Birth:  July 22, 1971          BSA:          1.911 m Patient Age:    49 years           BP:           145/83 mmHg Patient Gender: M                  HR:           92 bpm. Exam Location:  [facility] Procedure: 2D Echo, Color Doppler and Cardiac Doppler Indications:     R07.9 Chest Pain  History:         Patient has no prior history of Echocardiogram examinations.                  Risk Factors:Hypertension.  Sonographer:     Humphrey Rolls RDCS (AE) Referring Phys:  [id number] Rometta Emery Diagnosing Phys: Harold Hedge MD  Sonographer Comments: Suboptimal subcostal window. IMPRESSIONS  1. Left ventricular ejection  fraction, by estimation, is 60 to 65%. Left ventricular ejection fraction by PLAX is 60 %. The left ventricle has normal function. The left ventricle has no regional wall motion abnormalities. Left ventricular diastolic parameters were normal.  2. Right ventricular systolic function is normal. The right ventricular size is normal.  3. The mitral valve is grossly normal. Trivial mitral valve regurgitation.  4. The aortic valve is grossly normal. Aortic valve regurgitation is not visualized. FINDINGS  Left Ventricle: Left ventricular ejection fraction, by estimation, is 60 to 65%. Left ventricular ejection fraction by PLAX is 60 %. The  left ventricle has normal function. The left ventricle has no regional wall motion abnormalities. The left ventricular internal cavity size was normal in size. There is no left ventricular hypertrophy. Left ventricular diastolic parameters were normal. Right Ventricle: The right ventricular size is normal. No increase in right ventricular wall thickness. Right ventricular systolic function is normal. Left Atrium: Left atrial size was normal in size. Right Atrium: Right atrial size was normal in size. Pericardium: There is no evidence of pericardial effusion. Mitral Valve: The mitral valve is grossly normal. Trivial mitral valve regurgitation. MV peak gradient, 5.2 mmHg. The mean mitral valve gradient is 3.0 mmHg. Tricuspid Valve: The tricuspid valve is grossly normal. Tricuspid valve regurgitation is trivial. Aortic Valve: The aortic valve is grossly normal. Aortic valve regurgitation is not visualized. Aortic valve mean gradient measures 4.0 mmHg. Aortic valve peak gradient measures 7.3 mmHg. Aortic valve area, by VTI measures 3.14 cm. Pulmonic Valve: The pulmonic valve was not well visualized. Pulmonic valve regurgitation is trivial. Aorta: The aortic root is normal in size and structure. IAS/Shunts: The interatrial septum was not assessed.  LEFT VENTRICLE PLAX 2D LV EF:         Left             Diastology                ventricular     LV e' lateral:   13.40 cm/s                ejection        LV E/e' lateral: 6.9                fraction by     LV e' medial:    7.29 cm/s                PLAX is 60      LV E/e' medial:  12.8                %. LVIDd:         4.75 cm LVIDs:         3.22 cm LV PW:         0.92 cm LV IVS:        0.70 cm LVOT diam:     2.20 cm LV SV:         68 LV SV Index:   36 LVOT Area:     3.80 cm  RIGHT VENTRICLE RV Basal diam:  3.14 cm LEFT ATRIUM             Index       RIGHT ATRIUM           Index LA diam:        3.10 cm 1.62 cm/m  RA Area:     11.60 cm LA Vol (A2C):   24.7 ml 12.93 ml/m RA Volume:   25.50 ml  13.35 ml/m LA Vol (A4C):   23.7 ml 12.40 ml/m LA Biplane Vol: 26.0 ml 13.61 ml/m  AORTIC VALVE                   PULMONIC VALVE AV Area (Vmax):    2.70 cm    PV Vmax:       1.03 m/s AV Area (Vmean):   3.10 cm    PV Vmean:      72.300 cm/s AV Area (VTI):     3.14 cm    PV VTI:  0.168 m AV Vmax:           135.00 cm/s PV Peak grad:  4.2 mmHg AV Vmean:          86.700 cm/s PV Mean grad:  2.0 mmHg AV VTI:            0.217 m AV Peak Grad:      7.3 mmHg AV Mean Grad:      4.0 mmHg LVOT Vmax:         96.00 cm/s LVOT Vmean:        70.600 cm/s LVOT VTI:          0.179 m LVOT/AV VTI ratio: 0.82  AORTA Ao Root diam: 3.50 cm MITRAL VALVE MV Area (PHT): 7.02 cm     SHUNTS MV Peak grad:  5.2 mmHg     Systemic VTI:  0.18 m MV Mean grad:  3.0 mmHg     Systemic Diam: 2.20 cm MV Vmax:       1.14 m/s MV Vmean:      76.8 cm/s MV Decel Time: 108 msec MV E velocity: 93.00 cm/s MV A velocity: 105.00 cm/s MV E/A ratio:  0.89 Bartholome Bill MD Electronically signed by Bartholome Bill MD Signature Date/Time: 12/03/2019/1:27:06 PM    Final     (Echo, Carotid, EGD, Colonoscopy, ERCP)    Subjective: Patient seen and examined Stable after stress test No active chest pain  Discharge Exam: Vitals:   12/03/19 1400 12/03/19 1414  BP: (!) 131/93   Pulse: 71   Resp: 16   Temp:  98  F (36.7 C)  SpO2: 97%    Vitals:   12/03/19 1150 12/03/19 1300 12/03/19 1400 12/03/19 1414  BP: (!) 135/91 129/84 (!) 131/93   Pulse: 92  71   Resp: 13  16   Temp:    98 F (36.7 C)  TempSrc:    Oral  SpO2: 100%  97%   Weight:      Height:        General: Pt is alert, awake, not in acute distress Cardiovascular: RRR, S1/S2 +, no rubs, no gallops Respiratory: CTA bilaterally, no wheezing, no rhonchi Abdominal: Soft, NT, ND, bowel sounds + Extremities: no edema, no cyanosis    The results of significant diagnostics from this hospitalization (including imaging, microbiology, ancillary and laboratory) are listed below for reference.     Microbiology: Recent Results (from the past 240 hour(s))  SARS Coronavirus 2 by RT PCR (hospital order, performed in [facility] hospital lab) Nasopharyngeal Nasopharyngeal Swab     Status: None   Collection Time: 12/03/19  8:04 AM   Specimen: Nasopharyngeal Swab  Result Value Ref Range Status   SARS Coronavirus 2 NEGATIVE NEGATIVE Final    Comment: (NOTE) SARS-CoV-2 target nucleic acids are NOT DETECTED. The SARS-CoV-2 RNA is generally detectable in upper and lower respiratory specimens during the acute phase of infection. The lowest concentration of SARS-CoV-2 viral copies this assay can detect is 250 copies / mL. A negative result does not preclude SARS-CoV-2 infection and should not be used as the sole basis for treatment or other patient management decisions.  A negative result may occur with improper specimen collection / handling, submission of specimen other than nasopharyngeal swab, presence of viral mutation(s) within the areas targeted by this assay, and inadequate number of viral copies (<250 copies / mL). A negative result must be combined with clinical observations, patient history, and epidemiological information. Fact Sheet for Patients:  BoilerBrush.com.cy Fact Sheet for Healthcare  Providers: https://pope.com/ This test is not yet approved or cleared  by the [place] FDA and has been authorized for detection and/or diagnosis of SARS-CoV-2 by FDA under an Emergency Use Authorization (EUA).  This EUA will remain in effect (meaning this test can be used) for the duration of the COVID-19 declaration under Section 564(b)(1) of the Act, 21 U.S.C. section 360bbb-3(b)(1), unless the authorization is terminated or revoked sooner. Performed at [facility], [address] [place], [place] [address]      Labs: BNP (last 3 results) No results for input(s): BNP in the last 8760 hours. Basic Metabolic Panel: Recent Labs  Lab 12/02/19 2012  NA 139  K 3.4*  CL 104  CO2 26  GLUCOSE 107*  BUN 17  CREATININE 1.13  CALCIUM 9.4   Liver Function Tests: No results for input(s): AST, ALT, ALKPHOS, BILITOT, PROT, ALBUMIN in the last 168 hours. No results for input(s): LIPASE, AMYLASE in the last 168 hours. No results for input(s): AMMONIA in the last 168 hours. CBC: Recent Labs  Lab 12/02/19 2012  WBC 5.6  HGB 16.3  HCT 45.8  MCV 84.2  PLT 164   Cardiac Enzymes: No results for input(s): CKTOTAL, CKMB, CKMBINDEX, TROPONINI in the last 168 hours. BNP: Invalid input(s): POCBNP CBG: No results for input(s): GLUCAP in the last 168 hours. D-Dimer No results for input(s): DDIMER in the last 72 hours. Hgb A1c No results for input(s): HGBA1C in the last 72 hours. Lipid Profile Recent Labs    12/03/19 0014  CHOL 153  HDL 37*  LDLCALC 84  TRIG 536*  CHOLHDL 4.1   Thyroid function studies No results for input(s): TSH, T4TOTAL, T3FREE, THYROIDAB in the last 72 hours.  Invalid input(s): FREET3 Anemia work up No results for input(s): VITAMINB12, FOLATE, FERRITIN, TIBC, IRON, RETICCTPCT in the last 72 hours. Urinalysis No results found for: COLORURINE, APPEARANCEUR, LABSPEC, PHURINE, GLUCOSEU, HGBUR, BILIRUBINUR, KETONESUR,  PROTEINUR, UROBILINOGEN, NITRITE, LEUKOCYTESUR Sepsis Labs Invalid input(s): PROCALCITONIN,  WBC,  LACTICIDVEN Microbiology Recent Results (from the past 240 hour(s))  SARS Coronavirus 2 by RT PCR (hospital order, performed in [facility] hospital lab) Nasopharyngeal Nasopharyngeal Swab     Status: None   Collection Time: 12/03/19  8:04 AM   Specimen: Nasopharyngeal Swab  Result Value Ref Range Status   SARS Coronavirus 2 NEGATIVE NEGATIVE Final    Comment: (NOTE) SARS-CoV-2 target nucleic acids are NOT DETECTED. The SARS-CoV-2 RNA is generally detectable in upper and lower respiratory specimens during the acute phase of infection. The lowest concentration of SARS-CoV-2 viral copies this assay can detect is 250 copies / mL. A negative result does not preclude SARS-CoV-2 infection and should not be used as the sole basis for treatment or other patient management decisions.  A negative result may occur with improper specimen collection / handling, submission of specimen other than nasopharyngeal swab, presence of viral mutation(s) within the areas targeted by this assay, and inadequate number of viral copies (<250 copies / mL). A negative result must be combined with clinical observations, patient history, and epidemiological information. Fact Sheet for Patients:   BoilerBrush.com.cy Fact Sheet for Healthcare Providers: https://pope.com/ This test is not yet approved or cleared  by the [place] FDA and has been authorized for detection and/or diagnosis of SARS-CoV-2 by FDA under an Emergency Use Authorization (EUA).  This EUA will remain in effect (meaning this test can be used) for the duration of the COVID-19 declaration  under Section 564(b)(1) of the Act, 21 U.S.C. section 360bbb-3(b)(1), unless the authorization is terminated or revoked sooner. Performed at [facility], [address], [place], [place] [address]       Time coordinating discharge: Over 30 minutes  SIGNED:   Tresa Moore, MD  [facility] 12/03/2019, 2:35 PM Pager   If 7PM-7AM, please contact night-coverage

## 2019-12-03 NOTE — ED Notes (Signed)
Unable to draw troponin at this time, pt in nuclear med.  Will draw lab and give meds when returns.

## 2019-12-03 NOTE — ED Notes (Signed)
Date and time results received: 12/03/19 06:32 (use smartphrase ".now" to insert current time)  Test: Troponin Critical Value: 621  Name of Provider Notified: Steward Drone Morrision  Orders Received? Or Actions Taken?: Actions Taken: Provider notified

## 2019-12-03 NOTE — ED Notes (Signed)
Date and time results received: 12/03/19 04:03   Test: Troponin  Critical Value: 425  Name of Provider Notified: Manuela Schwartz  Orders Received? Or Actions Taken?: Actions Taken: Provider notified

## 2019-12-03 NOTE — Progress Notes (Signed)
*  PRELIMINARY RESULTS* Echocardiogram 2D Echocardiogram has been performed.  Russell Simon Lejla Moeser 12/03/2019, 11:16 AM

## 2019-12-03 NOTE — ED Notes (Signed)
Pt states coming into the chest pain that started Sunday but got worse Monday. Pt states feeling better and denies pain currently. Pt denies SOB.

## 2019-12-03 NOTE — Progress Notes (Addendum)
ANTICOAGULATION CONSULT NOTE - Initial Consult  Pharmacy Consult for Heparin Drip Indication: chest pain/ACS  No Known Allergies  Patient Measurements: Height: 5\' 7"  (170.2 cm) Weight: 79.4 kg (175 lb) IBW/kg (Calculated) : 66.1 Heparin Dosing Weight:   Vital Signs: Temp: 98.6 F (37 C) (05/10 2004) Temp Source: Oral (05/10 2004) BP: 119/87 (05/11 0700) Pulse Rate: 66 (05/11 0700)  Labs: Recent Labs    12/02/19 2012 12/02/19 2214 12/03/19 0126 12/03/19 0333 12/03/19 0524  HGB 16.3  --   --   --   --   HCT 45.8  --   --   --   --   PLT 164  --   --   --   --   CREATININE 1.13  --   --   --   --   TROPONINIHS 104*   < > 425* 532* 621*   < > = values in this interval not displayed.    Estimated Creatinine Clearance: 81.6 mL/min (by C-G formula based on SCr of 1.13 mg/dL).   Medical History: Past Medical History:  Diagnosis Date  . Hypertension     Medications:  Scheduled:  . aspirin EC  81 mg Oral Daily  . heparin  4,000 Units Intravenous Once  . hydrochlorothiazide  25 mg Oral Daily  . meloxicam  15 mg Oral Daily  . metoprolol tartrate  25 mg Oral BID   Infusions:  . sodium chloride 125 mL/hr at 12/03/19 0529  . heparin      Assessment: 49 yo M to start Heparin drip for ACS/STEMI, CP. Hx of covid PNA Feb 2021. 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 Elevated troponin No Anticoag PTA Hgb 16.3  Plt 164  INR 1.0  APTT 30  Goal of Therapy:  Heparin level 0.3-0.7 units/ml Monitor platelets by anticoagulation protocol: Yes   Plan:  Give 4000 units bolus x 1 Start heparin infusion at 1000 units/hr Check anti-Xa level in 6 hours and daily while on heparin Continue to monitor H&H and platelets  Tazia Illescas A 12/03/2019,7:50 AM

## 2019-12-03 NOTE — ED Notes (Signed)
Date and time results received: 12/03/19 0514 (use smartphrase ".now" to insert current time)  Test: Troponin  Critical Value: 532  Name of Provider Notified: Manuela Schwartz  Orders Received? Or Actions Taken?: Actions Taken: Provider notified

## 2019-12-03 NOTE — ED Notes (Signed)
Pt remains in nuclear med

## 2019-12-03 NOTE — ED Notes (Signed)
Pt remains in nuclear med 

## 2019-12-03 NOTE — ED Notes (Signed)
Repeat EKG completed as per provider. Provider notified EKG was exported to chart.

## 2019-12-04 LAB — HIV ANTIBODY (ROUTINE TESTING W REFLEX): HIV Screen 4th Generation wRfx: NONREACTIVE

## 2019-12-09 DIAGNOSIS — I10 Essential (primary) hypertension: Secondary | ICD-10-CM | POA: Diagnosis not present

## 2019-12-09 DIAGNOSIS — R079 Chest pain, unspecified: Secondary | ICD-10-CM | POA: Diagnosis not present

## 2019-12-09 DIAGNOSIS — R0602 Shortness of breath: Secondary | ICD-10-CM | POA: Diagnosis not present

## 2019-12-09 DIAGNOSIS — R778 Other specified abnormalities of plasma proteins: Secondary | ICD-10-CM | POA: Diagnosis not present

## 2020-01-02 DIAGNOSIS — J90 Pleural effusion, not elsewhere classified: Secondary | ICD-10-CM | POA: Diagnosis not present

## 2020-01-02 DIAGNOSIS — R778 Other specified abnormalities of plasma proteins: Secondary | ICD-10-CM | POA: Diagnosis not present

## 2020-01-02 DIAGNOSIS — R918 Other nonspecific abnormal finding of lung field: Secondary | ICD-10-CM | POA: Diagnosis not present

## 2020-01-02 DIAGNOSIS — R079 Chest pain, unspecified: Secondary | ICD-10-CM | POA: Diagnosis not present

## 2020-01-02 DIAGNOSIS — I2510 Atherosclerotic heart disease of native coronary artery without angina pectoris: Secondary | ICD-10-CM | POA: Diagnosis not present

## 2020-01-02 DIAGNOSIS — I10 Essential (primary) hypertension: Secondary | ICD-10-CM | POA: Diagnosis not present

## 2020-01-02 DIAGNOSIS — R0602 Shortness of breath: Secondary | ICD-10-CM | POA: Diagnosis not present

## 2020-01-02 DIAGNOSIS — Z8249 Family history of ischemic heart disease and other diseases of the circulatory system: Secondary | ICD-10-CM | POA: Diagnosis not present

## 2020-01-02 DIAGNOSIS — R931 Abnormal findings on diagnostic imaging of heart and coronary circulation: Secondary | ICD-10-CM | POA: Diagnosis not present

## 2020-01-16 DIAGNOSIS — Z01818 Encounter for other preprocedural examination: Secondary | ICD-10-CM | POA: Diagnosis not present

## 2020-01-16 DIAGNOSIS — R079 Chest pain, unspecified: Secondary | ICD-10-CM | POA: Diagnosis not present

## 2020-01-16 DIAGNOSIS — I10 Essential (primary) hypertension: Secondary | ICD-10-CM | POA: Diagnosis not present

## 2020-01-17 ENCOUNTER — Other Ambulatory Visit: Payer: Self-pay

## 2020-01-17 ENCOUNTER — Other Ambulatory Visit
Admission: RE | Admit: 2020-01-17 | Discharge: 2020-01-17 | Disposition: A | Discharge status: Home / Self Care | Payer: BC Managed Care – PPO | Source: Ambulatory Visit | Attending: Cardiology | Admitting: Cardiology

## 2020-01-17 DIAGNOSIS — Z20822 Contact with and (suspected) exposure to covid-19: Secondary | ICD-10-CM | POA: Diagnosis not present

## 2020-01-17 DIAGNOSIS — Z01812 Encounter for preprocedural laboratory examination: Secondary | ICD-10-CM | POA: Diagnosis not present

## 2020-01-18 LAB — SARS CORONAVIRUS 2 (TAT 6-24 HRS): SARS Coronavirus 2: NEGATIVE

## 2020-01-20 ENCOUNTER — Other Ambulatory Visit: Payer: BC Managed Care – PPO

## 2020-01-21 ENCOUNTER — Other Ambulatory Visit: Payer: Self-pay

## 2020-01-21 ENCOUNTER — Ambulatory Visit
Admission: RE | Admit: 2020-01-21 | Discharge: 2020-01-21 | Disposition: A | Discharge status: Home / Self Care | Payer: BC Managed Care – PPO | Attending: Cardiology | Admitting: Cardiology

## 2020-01-21 ENCOUNTER — Encounter: Payer: Self-pay | Admitting: Cardiology

## 2020-01-21 ENCOUNTER — Encounter
Admission: RE | Disposition: A | Discharge status: Home / Self Care | Payer: Self-pay | Source: Home / Self Care | Attending: Cardiology

## 2020-01-21 DIAGNOSIS — I2510 Atherosclerotic heart disease of native coronary artery without angina pectoris: Secondary | ICD-10-CM | POA: Insufficient documentation

## 2020-01-21 DIAGNOSIS — I10 Essential (primary) hypertension: Secondary | ICD-10-CM | POA: Insufficient documentation

## 2020-01-21 DIAGNOSIS — Z79899 Other long term (current) drug therapy: Secondary | ICD-10-CM | POA: Insufficient documentation

## 2020-01-21 DIAGNOSIS — R079 Chest pain, unspecified: Secondary | ICD-10-CM | POA: Diagnosis not present

## 2020-01-21 DIAGNOSIS — R778 Other specified abnormalities of plasma proteins: Secondary | ICD-10-CM

## 2020-01-21 DIAGNOSIS — I25119 Atherosclerotic heart disease of native coronary artery with unspecified angina pectoris: Secondary | ICD-10-CM | POA: Diagnosis not present

## 2020-01-21 DIAGNOSIS — R0602 Shortness of breath: Secondary | ICD-10-CM

## 2020-01-21 HISTORY — PX: LEFT HEART CATH AND CORONARY ANGIOGRAPHY: CATH118249

## 2020-01-21 SURGERY — LEFT HEART CATH AND CORONARY ANGIOGRAPHY
Anesthesia: Moderate Sedation | Laterality: Left

## 2020-01-21 MED ORDER — LIDOCAINE HCL (PF) 1 % IJ SOLN
INTRAMUSCULAR | Status: DC | PRN
  Administered 2020-01-21: 2 mL via SUBCUTANEOUS

## 2020-01-21 MED ORDER — MIDAZOLAM HCL 2 MG/2ML IJ SOLN
INTRAMUSCULAR | Status: AC
  Filled 2020-01-21: qty 2

## 2020-01-21 MED ORDER — SODIUM CHLORIDE 0.9% FLUSH: 3.0000 mL | Freq: Two times a day (BID) | INTRAVENOUS | Status: DC

## 2020-01-21 MED ORDER — SODIUM CHLORIDE 0.9% FLUSH: 3.0000 mL | INTRAVENOUS | Status: DC | PRN

## 2020-01-21 MED ORDER — HEPARIN SODIUM (PORCINE) 1000 UNIT/ML IJ SOLN
INTRAMUSCULAR | Status: DC | PRN
  Administered 2020-01-21: 4000 [IU] via INTRAVENOUS

## 2020-01-21 MED ORDER — MIDAZOLAM HCL 2 MG/2ML IJ SOLN
INTRAMUSCULAR | Status: DC | PRN
  Administered 2020-01-21: 1 mg via INTRAVENOUS
  Administered 2020-01-21: 0.5 mg via INTRAVENOUS

## 2020-01-21 MED ORDER — VERAPAMIL HCL 2.5 MG/ML IV SOLN
INTRAVENOUS | Status: AC
  Filled 2020-01-21: qty 2

## 2020-01-21 MED ORDER — FENTANYL CITRATE (PF) 100 MCG/2ML IJ SOLN
INTRAMUSCULAR | Status: AC
  Filled 2020-01-21: qty 2

## 2020-01-21 MED ORDER — LABETALOL HCL 5 MG/ML IV SOLN: 10.0000 mg | INTRAVENOUS | Status: DC | PRN

## 2020-01-21 MED ORDER — SODIUM CHLORIDE 0.9 % WEIGHT BASED INFUSION: 1.0000 mL/kg/h | INTRAVENOUS | Status: DC

## 2020-01-21 MED ORDER — HYDRALAZINE HCL 20 MG/ML IJ SOLN: 10.0000 mg | INTRAMUSCULAR | Status: DC | PRN

## 2020-01-21 MED ORDER — ASPIRIN 81 MG PO CHEW
CHEWABLE_TABLET | ORAL | Status: AC
  Administered 2020-01-21: 81 mg via ORAL
  Filled 2020-01-21: qty 1

## 2020-01-21 MED ORDER — SODIUM CHLORIDE 0.9 % IV SOLN: 250.0000 mL | INTRAVENOUS | Status: DC | PRN

## 2020-01-21 MED ORDER — MIDAZOLAM HCL 2 MG/ML PO SYRP
ORAL_SOLUTION | ORAL | Status: AC
  Administered 2020-01-21: 4 mg via ORAL
  Filled 2020-01-21: qty 4

## 2020-01-21 MED ORDER — HEPARIN SODIUM (PORCINE) 1000 UNIT/ML IJ SOLN
INTRAMUSCULAR | Status: AC
  Filled 2020-01-21: qty 1

## 2020-01-21 MED ORDER — SODIUM CHLORIDE 0.9 % WEIGHT BASED INFUSION
232.8000 mL/h | INTRAVENOUS | Status: AC
  Administered 2020-01-21: 3 mL/kg/h via INTRAVENOUS

## 2020-01-21 MED ORDER — IOHEXOL 300 MG/ML  SOLN
INTRAMUSCULAR | Status: DC | PRN
  Administered 2020-01-21: 80 mL

## 2020-01-21 MED ORDER — ASPIRIN 81 MG PO CHEW: 81.0000 mg | CHEWABLE_TABLET | ORAL | Status: AC

## 2020-01-21 MED ORDER — FENTANYL CITRATE (PF) 100 MCG/2ML IJ SOLN
INTRAMUSCULAR | Status: DC | PRN
  Administered 2020-01-21: 50 ug via INTRAVENOUS
  Administered 2020-01-21: 25 ug via INTRAVENOUS

## 2020-01-21 MED ORDER — ONDANSETRON HCL 4 MG/2ML IJ SOLN: 4.0000 mg | Freq: Four times a day (QID) | INTRAMUSCULAR | Status: DC | PRN

## 2020-01-21 MED ORDER — HEPARIN (PORCINE) IN NACL 1000-0.9 UT/500ML-% IV SOLN
INTRAVENOUS | Status: DC | PRN
  Administered 2020-01-21: 500 mL

## 2020-01-21 MED ORDER — ACETAMINOPHEN 325 MG PO TABS: 650.0000 mg | ORAL_TABLET | ORAL | Status: DC | PRN

## 2020-01-21 MED ORDER — MIDAZOLAM HCL 2 MG/ML PO SYRP: 4.0000 mg | ORAL_SOLUTION | Freq: Once | ORAL | Status: AC

## 2020-01-21 MED ORDER — HEPARIN (PORCINE) IN NACL 1000-0.9 UT/500ML-% IV SOLN
INTRAVENOUS | Status: AC
  Filled 2020-01-21: qty 1000

## 2020-01-21 MED ORDER — LIDOCAINE HCL (PF) 1 % IJ SOLN
INTRAMUSCULAR | Status: AC
  Filled 2020-01-21: qty 30

## 2020-01-21 MED ORDER — VERAPAMIL HCL 2.5 MG/ML IV SOLN
INTRAVENOUS | Status: DC | PRN
  Administered 2020-01-21: 2.5 mg via INTRA_ARTERIAL

## 2020-01-21 SURGICAL SUPPLY — 7 items
CATH 5F 110X4 TIG (CATHETERS) ×3 IMPLANT
DEVICE RAD TR BAND REGULAR (VASCULAR PRODUCTS) ×3 IMPLANT
GLIDESHEATH SLEND SS 6F .021 (SHEATH) ×3 IMPLANT
GUIDEWIRE INQWIRE 1.5J.035X260 (WIRE) ×1 IMPLANT
INQWIRE 1.5J .035X260CM (WIRE) ×3
KIT MANI 3VAL PERCEP (MISCELLANEOUS) ×3 IMPLANT
PACK CARDIAC CATH (CUSTOM PROCEDURE TRAY) ×3 IMPLANT

## 2020-01-21 NOTE — Progress Notes (Signed)
No increase in swelling at TR band site. Released additional 1 cc of air, continue to monitor.

## 2020-01-21 NOTE — Progress Notes (Signed)
Continues same, no further swelling.

## 2020-01-21 NOTE — Progress Notes (Signed)
Continues same.  No increase in swelling.

## 2020-01-21 NOTE — Discharge Instructions (Signed)
Angiogram, Care After This sheet gives you information about how to care for yourself after your procedure. Your doctor may also give you more specific instructions. If you have problems or questions, contact your doctor. Follow these instructions at home: Insertion site care  Follow instructions from your doctor about how to take care of your long, thin tube (catheter) insertion area. Make sure you: ? Wash your hands with soap and water before you change your bandage (dressing). If you cannot use soap and water, use hand sanitizer. ? Change your bandage as told by your doctor. ? Leave stitches (sutures), skin glue, or skin tape (adhesive) strips in place. They may need to stay in place for 2 weeks or longer. If tape strips get loose and curl up, you may trim the loose edges. Do not remove tape strips completely unless your doctor says it is okay.  Do not take baths, swim, or use a hot tub until your doctor says it is okay.  You may shower 24-48 hours after the procedure or as told by your doctor. ? Gently wash the area with plain soap and water. ? Pat the area dry with a clean towel. ? Do not rub the area. This may cause bleeding.  Do not apply powder or lotion to the area. Keep the area clean and dry.  Check your insertion area every day for signs of infection. Check for: ? More redness, swelling, or pain. ? Fluid or blood. ? Warmth. ? Pus or a bad smell. Activity  Rest as told by your doctor, usually for 1-2 days.  Do not lift anything that is heavier than 10 lbs. (4.5 kg) or as told by your doctor.  Do not drive for 24 hours if you were given a medicine to help you relax (sedative).  Do not drive or use heavy machinery while taking prescription pain medicine. General instructions   Go back to your normal activities as told by your doctor, usually in about a week. Ask your doctor what activities are safe for you.  If the insertion area starts to bleed, lie flat and put  pressure on the area. If the bleeding does not stop, get help right away. This is an emergency.  Drink enough fluid to keep your pee (urine) clear or pale yellow.  Take over-the-counter and prescription medicines only as told by your doctor.  Keep all follow-up visits as told by your doctor. This is important. Contact a doctor if:  You have a fever.  You have chills.  You have more redness, swelling, or pain around your insertion area.  You have fluid or blood coming from your insertion area.  The insertion area feels warm to the touch.  You have pus or a bad smell coming from your insertion area.  You have more bruising around the insertion area.  Blood collects in the tissue around the insertion area (hematoma) that may be painful to the touch. Get help right away if:  You have a lot of pain in the insertion area.  The insertion area swells very fast.  The insertion area is bleeding, and the bleeding does not stop after holding steady pressure on the area.  The area near or just beyond the insertion area becomes pale, cool, tingly, or numb. These symptoms may be an emergency. Do not wait to see if the symptoms will go away. Get medical help right away. Call your local emergency services (911 in the [place] Do not drive yourself to the hospital.   Summary  After the procedure, it is common to have bruising and tenderness at the long, thin tube insertion area.  After the procedure, it is important to rest and drink plenty of fluids.  Do not take baths, swim, or use a hot tub until your doctor says it is okay to do so. You may shower 24-48 hours after the procedure or as told by your doctor.  If the insertion area starts to bleed, lie flat and put pressure on the area. If the bleeding does not stop, get help right away. This is an emergency. This information is not intended to replace advice given to you by your health care provider. Make sure you discuss any questions you have  with your health care provider. Document Revised: 06/23/2017 Document Reviewed: 07/05/2016 Elsevier Patient Education  2020 Elsevier Inc.   Radial Site Care  This sheet gives you information about how to care for yourself after your procedure. Your health care provider may also give you more specific instructions. If you have problems or questions, contact your health care provider. What can I expect after the procedure? After the procedure, it is common to have:  Bruising and tenderness at the catheter insertion area. Follow these instructions at home: Medicines  Take over-the-counter and prescription medicines only as told by your health care provider. Insertion site care  Follow instructions from your health care provider about how to take care of your insertion site. Make sure you: ? Wash your hands with soap and water before you change your bandage (dressing). If soap and water are not available, use hand sanitizer. ? Change your dressing as told by your health care provider. ? Leave stitches (sutures), skin glue, or adhesive strips in place. These skin closures may need to stay in place for 2 weeks or longer. If adhesive strip edges start to loosen and curl up, you may trim the loose edges. Do not remove adhesive strips completely unless your health care provider tells you to do that.  Check your insertion site every day for signs of infection. Check for: ? Redness, swelling, or pain. ? Fluid or blood. ? Pus or a bad smell. ? Warmth.  Do not take baths, swim, or use a hot tub until your health care provider approves.  You may shower 24-48 hours after the procedure, or as directed by your health care provider. ? Remove the dressing and gently wash the site with plain soap and water. ? Pat the area dry with a clean towel. ? Do not rub the site. That could cause bleeding.  Do not apply powder or lotion to the site. Activity   For 24 hours after the procedure, or as  directed by your health care provider: ? Do not flex or bend the affected arm. ? Do not push or pull heavy objects with the affected arm. ? Do not drive yourself home from the hospital or clinic. You may drive 24 hours after the procedure unless your health care provider tells you not to. ? Do not operate machinery or power tools.  Do not lift anything that is heavier than 10 lb (4.5 kg), or the limit that you are told, until your health care provider says that it is safe.  Ask your health care provider when it is okay to: ? Return to work or school. ? Resume usual physical activities or sports. ? Resume sexual activity. General instructions  If the catheter site starts to bleed, raise your arm and put firm pressure   on the site. If the bleeding does not stop, get help right away. This is a medical emergency.  If you went home on the same day as your procedure, a responsible adult should be with you for the first 24 hours after you arrive home.  Keep all follow-up visits as told by your health care provider. This is important. Contact a health care provider if:  You have a fever.  You have redness, swelling, or yellow drainage around your insertion site. Get help right away if:  You have unusual pain at the radial site.  The catheter insertion area swells very fast.  The insertion area is bleeding, and the bleeding does not stop when you hold steady pressure on the area.  Your arm or hand becomes pale, cool, tingly, or numb. These symptoms may represent a serious problem that is an emergency. Do not wait to see if the symptoms will go away. Get medical help right away. Call your local emergency services (911 in the [place] Do not drive yourself to the hospital. Summary  After the procedure, it is common to have bruising and tenderness at the site.  Follow instructions from your health care provider about how to take care of your radial site wound. Check the wound every day for  signs of infection.  Do not lift anything that is heavier than 10 lb (4.5 kg), or the limit that you are told, until your health care provider says that it is safe. This information is not intended to replace advice given to you by your health care provider. Make sure you discuss any questions you have with your health care provider. Document Revised: 08/16/2017 Document Reviewed: 08/16/2017 Elsevier Patient Education  2020 Elsevier Inc.   Moderate Conscious Sedation, Adult, Care After These instructions provide you with information about caring for yourself after your procedure. Your health care provider may also give you more specific instructions. Your treatment has been planned according to current medical practices, but problems sometimes occur. Call your health care provider if you have any problems or questions after your procedure. What can I expect after the procedure? After your procedure, it is common:  To feel sleepy for several hours.  To feel clumsy and have poor balance for several hours.  To have poor judgment for several hours.  To vomit if you eat too soon. Follow these instructions at home: For at least 24 hours after the procedure:   Do not: ? Participate in activities where you could fall or become injured. ? Drive. ? Use heavy machinery. ? Drink alcohol. ? Take sleeping pills or medicines that cause drowsiness. ? Make important decisions or sign legal documents. ? Take care of children on your own.  Rest. Eating and drinking  Follow the diet recommended by your health care provider.  If you vomit: ? Drink water, juice, or soup when you can drink without vomiting. ? Make sure you have little or no nausea before eating solid foods. General instructions  Have a responsible adult stay with you until you are awake and alert.  Take over-the-counter and prescription medicines only as told by your health care provider.  If you smoke, do not smoke without  supervision.  Keep all follow-up visits as told by your health care provider. This is important. Contact a health care provider if:  You keep feeling nauseous or you keep vomiting.  You feel light-headed.  You develop a rash.  You have a fever. Get help right away if:    You have trouble breathing. This information is not intended to replace advice given to you by your health care provider. Make sure you discuss any questions you have with your health care provider. Document Revised: 06/23/2017 Document Reviewed: 10/31/2015 Elsevier Patient Education  2020 Elsevier Inc.  

## 2020-01-21 NOTE — Progress Notes (Signed)
No new swelling,1 cc air removed from TR band, continue to observe site.

## 2020-01-21 NOTE — Progress Notes (Signed)
Right Radial mild swelling marked by Morrie Sheldon,  around TR Band, and Cath Lab notified of same.

## 2020-01-22 ENCOUNTER — Encounter: Payer: Self-pay | Admitting: Cardiology

## 2020-01-29 DIAGNOSIS — I2510 Atherosclerotic heart disease of native coronary artery without angina pectoris: Secondary | ICD-10-CM | POA: Diagnosis not present

## 2020-01-29 DIAGNOSIS — I10 Essential (primary) hypertension: Secondary | ICD-10-CM | POA: Diagnosis not present

## 2020-02-05 ENCOUNTER — Encounter: Payer: Self-pay | Admitting: Family Medicine

## 2020-02-06 NOTE — Telephone Encounter (Signed)
Either

## 2020-04-28 DIAGNOSIS — M25531 Pain in right wrist: Secondary | ICD-10-CM | POA: Diagnosis not present

## 2020-04-28 DIAGNOSIS — I10 Essential (primary) hypertension: Secondary | ICD-10-CM | POA: Diagnosis not present

## 2020-04-28 DIAGNOSIS — I2510 Atherosclerotic heart disease of native coronary artery without angina pectoris: Secondary | ICD-10-CM | POA: Diagnosis not present

## 2020-06-18 ENCOUNTER — Other Ambulatory Visit: Payer: Self-pay | Admitting: Family Medicine

## 2020-06-18 DIAGNOSIS — I10 Essential (primary) hypertension: Secondary | ICD-10-CM

## 2020-06-18 NOTE — Telephone Encounter (Signed)
Requested medications are due for refill today yes  Requested medications are on the active medication list yes  Last refill 10/26  Last visit 09/2018  Future visit scheduled no  Notes to clinic Failed protocol of valid visit within 6 months.

## 2020-08-25 ENCOUNTER — Other Ambulatory Visit: Payer: Self-pay | Admitting: Family Medicine

## 2020-08-25 DIAGNOSIS — I10 Essential (primary) hypertension: Secondary | ICD-10-CM

## 2020-08-25 NOTE — Telephone Encounter (Signed)
Requested medication (s) are due for refill today: yes  Requested medication (s) are on the active medication list: yes  Last refill: 07/19/2020  Future visit scheduled: no  Notes to clinic:  overdue for follow up appt Courtesy refill has already been given    Requested Prescriptions  Pending Prescriptions Disp Refills   hydrochlorothiazide (HYDRODIURIL) 25 MG tablet [Pharmacy Med Name: HYDROCHLOROTHIAZIDE 25 MG TAB] 30 tablet 1    Sig: TAKE 1 TABLET BY MOUTH EVERY DAY      Cardiovascular: Diuretics - Thiazide Failed - 08/25/2020  9:37 AM      Failed - K in normal range and within 360 days    Potassium  Date Value Ref Range Status  12/02/2019 3.4 (L) 3.5 - 5.1 mmol/L Final          Failed - Valid encounter within last 6 months    Recent Outpatient Visits           10 months ago History of COVID-19   [facility], Jodell Cipro, PA-C   11 months ago COVID-19 virus infection   [facility], Jodell Cipro, PA-C   1 year ago Right wrist pain   [facility], Jodell Cipro, PA-C   1 year ago Sprain of right wrist, initial encounter   [facility], Jodell Cipro, PA-C   1 year ago Hypokalemia   [facility], Jodell Cipro, PA-C                Passed - Ca in normal range and within 360 days    Calcium  Date Value Ref Range Status  12/02/2019 9.4 8.9 - 10.3 mg/dL Final          Passed - Cr in normal range and within 360 days    Creat  Date Value Ref Range Status  04/03/2017 1.18 0.60 - 1.35 mg/dL Final   Creatinine, Ser  Date Value Ref Range Status  12/02/2019 1.13 0.61 - 1.24 mg/dL Final          Passed - Na in normal range and within 360 days    Sodium  Date Value Ref Range Status  12/02/2019 139 135 - 145 mmol/L Final  10/15/2018 141 134 - 144 mmol/L Final          Passed - Last BP in normal range    BP Readings from Last 1 Encounters:  01/21/20 128/89

## 2020-09-04 ENCOUNTER — Other Ambulatory Visit: Payer: Self-pay

## 2020-09-04 ENCOUNTER — Encounter: Payer: Self-pay | Admitting: Physician Assistant

## 2020-09-04 ENCOUNTER — Ambulatory Visit (INDEPENDENT_AMBULATORY_CARE_PROVIDER_SITE_OTHER): Payer: No Typology Code available for payment source | Admitting: Physician Assistant

## 2020-09-04 VITALS — BP 162/100 | HR 76 | Temp 97.8°F | Wt 174.1 lb

## 2020-09-04 DIAGNOSIS — I10 Essential (primary) hypertension: Secondary | ICD-10-CM

## 2020-09-04 MED ORDER — HYDROCHLOROTHIAZIDE 25 MG PO TABS: 25.0000 mg | ORAL_TABLET | Freq: Every day | ORAL | 1 refills | Status: DC

## 2020-09-04 MED ORDER — AMLODIPINE BESYLATE 5 MG PO TABS: 5.0000 mg | ORAL_TABLET | Freq: Every day | ORAL | 0 refills | Status: DC

## 2020-09-04 NOTE — Progress Notes (Signed)
Established patient visit   Patient: Russell Simon   DOB: 08-22-1970   50 y.o. Male  MRN: [id number] Visit Date: 09/04/2020  Today's healthcare provider: Trey Sailors, PA-C   Chief Complaint  Patient presents with  . Hypertension  I,Lauren Aguayo M Hersel Mcmeen,acting as a scribe for [employer], PA-C.,have documented all relevant documentation on the behalf of Trey Sailors, PA-C,as directed by  Trey Sailors, PA-C while in the presence of Trey Sailors, PA-C.  Subjective    HPI   Patient had chest pain and elevated troponins following covid. Underwent cardiac cath 01/21/2020 which showed the following:     Aggressive risk factor modification was recommended.   Hypertension, follow-up  BP Readings from Last 3 Encounters:  09/04/20 (!) 162/100  01/21/20 128/89  12/03/19 (!) 131/93   Wt Readings from Last 3 Encounters:  09/04/20 174 lb 1.6 oz (79 kg)  01/21/20 171 lb (77.6 kg)  12/02/19 175 lb (79.4 kg)     He was last seen for hypertension 23 months ago.  BP at that visit was 120/80. Management since that visit includes continue current medication HCTZ 25 mg daily.   He reports good compliance with treatment. He is not having side effects.  He is following a Regular diet. He is not exercising. He does not smoke.  Use of agents associated with hypertension: none.   Outside blood pressures are being checked occasionally. Symptoms: No chest pain No chest pressure  No palpitations No syncope  No dyspnea No orthopnea  No paroxysmal nocturnal dyspnea No lower extremity edema   Pertinent labs: Lab Results  Component Value Date   CHOL 153 12/03/2019   HDL 37 (L) 12/03/2019   LDLCALC 84 12/03/2019   TRIG 158 (H) 12/03/2019   CHOLHDL 4.1 12/03/2019   Lab Results  Component Value Date   NA 139 12/02/2019   K 3.4 (L) 12/02/2019   CREATININE 1.13 12/02/2019   GFRNONAA >60 12/02/2019   GFRAA >60 12/02/2019   GLUCOSE 107 (H) 12/02/2019     The  10-year ASCVD risk score Denman George DC Jr., et al., 2013) is: 4.7%   ---------------------------------------------------------------------------------------------------      Medications: Outpatient Medications Prior to Visit  Medication Sig  . isosorbide mononitrate (IMDUR) 60 MG 24 hr tablet Take 60 mg by mouth daily.  . rosuvastatin (CRESTOR) 10 MG tablet Take 10 mg by mouth daily.  . meloxicam (MOBIC) 15 MG tablet Take 15 mg by mouth daily as needed for pain.   . nitroGLYCERIN (NITROSTAT) 0.4 MG SL tablet Place 1 tablet (0.4 mg total) under the tongue every 5 (five) minutes as needed for chest pain.  . [DISCONTINUED] hydrochlorothiazide (HYDRODIURIL) 25 MG tablet TAKE 1 TABLET BY MOUTH EVERY DAY   No facility-administered medications prior to visit.    Review of Systems     Objective    BP (!) 162/100 (BP Location: Left Arm, Patient Position: Sitting, Cuff Size: Large)   Pulse 76   Temp 97.8 F (36.6 C) (Oral)   Wt 174 lb 1.6 oz (79 kg)   SpO2 100%   BMI 27.27 kg/m     Physical Exam Constitutional:      Appearance: Normal appearance.  Cardiovascular:     Rate and Rhythm: Normal rate and regular rhythm.     Heart sounds: Normal heart sounds.  Pulmonary:     Effort: Pulmonary effort is normal.     Breath sounds: Normal breath sounds.  Skin:    General: Skin is warm and dry.  Neurological:     Mental Status: He is alert and oriented to person, place, and time. Mental status is at baseline.  Psychiatric:        Mood and Affect: Mood normal.        Behavior: Behavior normal.       No results found for any visits on 09/04/20.  Assessment & Plan    1. Primary hypertension  Uncontrolled. Check BP at home. If elevated, take 5 mg amlodipine daily and return in one month.   Patient reports BP still elevated. Recommend he take 5 mg daily for one week. If BP still >140/90, may take 10 mg daily. Follow up one month.   Patient reported via mychart that BP at home is  still elevated. Recommend  - amLODipine (NORVASC) 5 MG tablet; Take 1 tablet (5 mg total) by mouth daily.  Dispense: 90 tablet; Refill: 0  2. Essential hypertension  - hydrochlorothiazide (HYDRODIURIL) 25 MG tablet; Take 1 tablet (25 mg total) by mouth daily.  Dispense: 30 tablet; Refill: 1   No follow-ups on file.      ITrey Sailors, PA-C, have reviewed all documentation for this visit. The documentation on 09/08/20 for the exam, diagnosis, procedures, and orders are all accurate and complete.  The entirety of the information documented in the History of Present Illness, Review of Systems and Physical Exam were personally obtained by me. Portions of this information were initially documented by [facility] and reviewed by me for thoroughness and accuracy.     Maryella Shivers  [facility] [phone] (phone) [phone] (fax)  [facility]

## 2020-09-07 ENCOUNTER — Encounter: Payer: Self-pay | Admitting: Physician Assistant

## 2020-10-02 ENCOUNTER — Ambulatory Visit (INDEPENDENT_AMBULATORY_CARE_PROVIDER_SITE_OTHER): Payer: No Typology Code available for payment source | Admitting: Physician Assistant

## 2020-10-02 ENCOUNTER — Other Ambulatory Visit: Payer: Self-pay

## 2020-10-02 VITALS — BP 135/93 | HR 85 | Temp 98.4°F | Ht 67.0 in | Wt 170.4 lb

## 2020-10-02 DIAGNOSIS — I10 Essential (primary) hypertension: Secondary | ICD-10-CM

## 2020-10-02 MED ORDER — AMLODIPINE BESYLATE 10 MG PO TABS: 10.0000 mg | ORAL_TABLET | Freq: Every day | ORAL | 0 refills | Status: DC

## 2020-10-02 NOTE — Patient Instructions (Signed)

## 2020-10-02 NOTE — Progress Notes (Signed)
Established patient visit   Patient: Russell Simon   DOB: 1971/06/30   50 y.o. Male  MRN: [id number] Visit Date: 10/02/2020  Today's healthcare provider: Trey Sailors, PA-C   Chief Complaint  Patient presents with  . Hypertension   Subjective    HPI  Hypertension, follow-up  BP Readings from Last 3 Encounters:  10/02/20 (!) 135/93  09/04/20 (!) 162/100  01/21/20 128/89   Wt Readings from Last 3 Encounters:  10/02/20 170 lb 6.4 oz (77.3 kg)  09/04/20 174 lb 1.6 oz (79 kg)  01/21/20 171 lb (77.6 kg)     He was last seen for hypertension 1 months ago.  BP at that visit was 162/100. Management since that visit includes hydrochlorothiazide (HYDRODIURIL) 25 MG tablet.  He reports excellent compliance with treatment. He is not having side effects.  He is following a Regular diet. He is exercising. - works at a physically demanding job He does not smoke.  Use of agents associated with hypertension: none.   Outside blood pressures are 150s/90s. Symptoms: Yes chest pain - temporarily Yes chest pressure  No palpitations No syncope  No dyspnea No orthopnea  No paroxysmal nocturnal dyspnea No lower extremity edema   Pertinent labs: Lab Results  Component Value Date   CHOL 153 12/03/2019   HDL 37 (L) 12/03/2019   LDLCALC 84 12/03/2019   TRIG 158 (H) 12/03/2019   CHOLHDL 4.1 12/03/2019   Lab Results  Component Value Date   NA 139 12/02/2019   K 3.4 (L) 12/02/2019   CREATININE 1.13 12/02/2019   GFRNONAA >60 12/02/2019   GFRAA >60 12/02/2019   GLUCOSE 107 (H) 12/02/2019     The 10-year ASCVD risk score Denman George DC Jr., et al., 2013) is: 3.4%   ---------------------------------------------------------------------------------------------------      Medications: Outpatient Medications Prior to Visit  Medication Sig  . hydrochlorothiazide (HYDRODIURIL) 25 MG tablet Take 1 tablet (25 mg total) by mouth daily.  . isosorbide mononitrate (IMDUR) 60 MG 24  hr tablet Take 60 mg by mouth daily.  . meloxicam (MOBIC) 15 MG tablet Take 15 mg by mouth daily as needed for pain.   . nitroGLYCERIN (NITROSTAT) 0.4 MG SL tablet Place 1 tablet (0.4 mg total) under the tongue every 5 (five) minutes as needed for chest pain.  . rosuvastatin (CRESTOR) 10 MG tablet Take 10 mg by mouth daily.  . [DISCONTINUED] amLODipine (NORVASC) 5 MG tablet Take 1 tablet (5 mg total) by mouth daily.   No facility-administered medications prior to visit.    Review of Systems     Objective    BP (!) 135/93 (BP Location: Right Arm, Patient Position: Sitting, Cuff Size: Normal)   Pulse 85   Temp 98.4 F (36.9 C) (Oral)   Ht 5\' 7"  (1.702 m)   Wt 170 lb 6.4 oz (77.3 kg)   SpO2 98%   BMI 26.69 kg/m     Physical Exam Constitutional:      Appearance: Normal appearance.  Cardiovascular:     Rate and Rhythm: Normal rate and regular rhythm.     Heart sounds: Normal heart sounds.  Pulmonary:     Effort: Pulmonary effort is normal.     Breath sounds: Normal breath sounds.  Skin:    General: Skin is warm and dry.  Neurological:     Mental Status: He is alert and oriented to person, place, and time. Mental status is at baseline.  Psychiatric:  Mood and Affect: Mood normal.        Behavior: Behavior normal.       No results found for any visits on 10/02/20.  Assessment & Plan    1. Essential hypertension  Will increase to amlodipine 10 mg daily. Continue other medications. Follow up 3-4 months.   - amLODipine (NORVASC) 10 MG tablet; Take 1 tablet (10 mg total) by mouth daily.  Dispense: 90 tablet; Refill: 0   Return in about 3 months (around 01/02/2021).      ITrey Sailors, PA-C, have reviewed all documentation for this visit. The documentation on 10/06/20 for the exam, diagnosis, procedures, and orders are all accurate and complete.  The entirety of the information documented in the History of Present Illness, Review of Systems and Physical Exam  were personally obtained by me. Portions of this information were initially documented by Paticia Stack, CMA and reviewed by me for thoroughness and accuracy.     Maryella Shivers  [facility] [phone] (phone) [phone] (fax)  [facility]

## 2020-10-27 ENCOUNTER — Other Ambulatory Visit: Payer: Self-pay | Admitting: Physician Assistant

## 2020-10-27 DIAGNOSIS — I10 Essential (primary) hypertension: Secondary | ICD-10-CM

## 2020-11-18 NOTE — Progress Notes (Signed)
Acute Office Visit  Subjective:    Patient ID: Russell Simon, male    DOB: 06/09/1971, 50 y.o.   MRN: [id number]  Chief Complaint  Patient presents with  . Foot Swelling  . Eye Drainage    HPI Patient is in today for ankle swelling. Patient reports swelling is worse late in the day after working. He reports swelling increased after increasing amlodipine to 10mg  daily on 10/02/20. Patient denies any chest pain, shortness of breath. Patient reports better blood pressure readings since increase of amlodipine.    Patient C/O right eye tearing a lot in the last few weeks. Patient denies any injures. Patient reports some blurriness. He reports he has seen his eye doctor with in the last year.  Past Medical History:  Diagnosis Date  . Hypertension     Past Surgical History:  Procedure Laterality Date  . ADENOIDECTOMY    . LEFT HEART CATH AND CORONARY ANGIOGRAPHY Left 01/21/2020   Procedure: LEFT HEART CATH AND CORONARY ANGIOGRAPHY prob PCI;  Surgeon: 01/23/2020, MD;  Location: [facility] INVASIVE CV LAB;  Service: Cardiovascular;  Laterality: Left;  . TONSILLECTOMY AND ADENOIDECTOMY  1983    Family History  Problem Relation Age of Onset  . Aneurysm Maternal Grandmother   . Diabetes Maternal Grandfather   . CVA Paternal Grandmother   . Lung cancer Paternal Grandfather   . Hypertension Mother   . Hypertension Father   . Urolithiasis Father     Social History   Socioeconomic History  . Marital status: Single    Spouse name: Not on file  . Number of children: Not on file  . Years of education: Not on file  . Highest education level: Not on file  Occupational History  . Not on file  Tobacco Use  . Smoking status: Never Smoker  . Smokeless tobacco: Never Used  Substance and Sexual Activity  . Alcohol use: No    Alcohol/week: 0.0 standard drinks  . Drug use: No  . Sexual activity: Not on file  Other Topics Concern  . Not on file  Social History Narrative  .  Not on file   Social Determinants of Health   Financial Resource Strain: Not on file  Food Insecurity: Not on file  Transportation Needs: Not on file  Physical Activity: Not on file  Stress: Not on file  Social Connections: Not on file  Intimate Partner Violence: Not on file    Outpatient Medications Prior to Visit  Medication Sig Dispense Refill  . amLODipine (NORVASC) 10 MG tablet Take 1 tablet (10 mg total) by mouth daily. 90 tablet 0  . aspirin 81 MG EC tablet Take by mouth.    . hydrochlorothiazide (HYDRODIURIL) 25 MG tablet TAKE 1 TABLET (25 MG TOTAL) BY MOUTH DAILY. *NEEDS OFFICE VISIT FOR MORE REFILLS* 30 tablet 1  . isosorbide mononitrate (IMDUR) 60 MG 24 hr tablet Take 60 mg by mouth daily.    . meloxicam (MOBIC) 15 MG tablet Take 15 mg by mouth daily as needed for pain.     . nitroGLYCERIN (NITROSTAT) 0.4 MG SL tablet Place 1 tablet (0.4 mg total) under the tongue every 5 (five) minutes as needed for chest pain. 30 tablet 12  . rosuvastatin (CRESTOR) 10 MG tablet Take 10 mg by mouth daily.     No facility-administered medications prior to visit.    No Known Allergies  Review of Systems  Constitutional: Negative for appetite change and fatigue.  Eyes: Positive for discharge  and visual disturbance.  Respiratory: Negative for chest tightness and shortness of breath.   Cardiovascular: Positive for leg swelling. Negative for chest pain and palpitations.  Gastrointestinal: Negative for nausea.       Objective:    Physical Exam Constitutional:      General: He is not in acute distress.    Appearance: He is well-developed.  HENT:     Head: Normocephalic and atraumatic.     Right Ear: Hearing normal.     Left Ear: Hearing normal.     Nose: Nose normal.  Eyes:     General: Lids are normal. No scleral icterus.       Right eye: No discharge.        Left eye: No discharge.     Conjunctiva/sclera: Conjunctivae normal.  Cardiovascular:     Rate and Rhythm: Regular  rhythm.     Pulses: Normal pulses.  Pulmonary:     Effort: Pulmonary effort is normal. No respiratory distress.  Abdominal:     General: Bowel sounds are normal.     Palpations: Abdomen is soft.  Musculoskeletal:        General: No swelling. Normal range of motion.     Cervical back: Normal range of motion.     Comments: No swelling or discomfort today.  Skin:    Findings: No lesion or rash.  Neurological:     Mental Status: He is alert and oriented to person, place, and time.  Psychiatric:        Speech: Speech normal.        Behavior: Behavior normal.        Thought Content: Thought content normal.     BP 126/68 (BP Location: Left Arm, Patient Position: Sitting, Cuff Size: Normal)   Pulse 82   Temp 98.2 F (36.8 C) (Oral)   Resp 16   Ht 5\' 7"  (1.702 m)   Wt 172 lb 9.6 oz (78.3 kg)   SpO2 100%   BMI 27.03 kg/m  Wt Readings from Last 3 Encounters:  11/19/20 172 lb 9.6 oz (78.3 kg)  10/02/20 170 lb 6.4 oz (77.3 kg)  09/04/20 174 lb 1.6 oz (79 kg)    Health Maintenance Due  Topic Date Due  . Hepatitis C Screening  Never done  . COVID-19 Vaccine (1) Never done  . TETANUS/TDAP  Never done  . COLONOSCOPY (Pts 45-14yrs Insurance coverage will need to be confirmed)  Never done    There are no preventive care reminders to display for this patient.   Lab Results  Component Value Date   TSH 2.140 09/18/2018   Lab Results  Component Value Date   WBC 5.6 12/02/2019   HGB 16.3 12/02/2019   HCT 45.8 12/02/2019   MCV 84.2 12/02/2019   PLT 164 12/02/2019   Lab Results  Component Value Date   NA 139 12/02/2019   K 3.4 (L) 12/02/2019   CO2 26 12/02/2019   GLUCOSE 107 (H) 12/02/2019   BUN 17 12/02/2019   CREATININE 1.13 12/02/2019   BILITOT 0.4 09/18/2018   ALKPHOS 66 09/18/2018   AST 20 09/18/2018   ALT 31 09/18/2018   PROT 6.7 09/18/2018   ALBUMIN 4.6 09/18/2018   CALCIUM 9.4 12/02/2019   ANIONGAP 9 12/02/2019   Lab Results  Component Value Date   CHOL  153 12/03/2019   Lab Results  Component Value Date   HDL 37 (L) 12/03/2019   Lab Results  Component Value Date   LDLCALC  84 12/03/2019   Lab Results  Component Value Date   TRIG 158 (H) 12/03/2019   Lab Results  Component Value Date   CHOLHDL 4.1 12/03/2019   No results found for: HGBA1C     Assessment & Plan:   1. Swelling of both ankles Occurs by the end of his work day. Has been noticed since increasing Amlodipine to 10 mg qd and still taking Imdur daily, also. Will check labs for metabolic anomalies. May need compression stockings. - CBC with Differential/Platelet - Comprehensive metabolic panel - TSH  2. Essential hypertension Well controlled. Monitor salt intake. - CBC with Differential/Platelet - Comprehensive metabolic panel - TSH  3. History of hypokalemia No cramps or muscle weakness. Recheck labs. - Comprehensive metabolic panel       Hyacinth Meeker, CMA

## 2020-11-19 ENCOUNTER — Encounter: Payer: Self-pay | Admitting: Family Medicine

## 2020-11-19 ENCOUNTER — Other Ambulatory Visit: Payer: Self-pay

## 2020-11-19 ENCOUNTER — Ambulatory Visit (INDEPENDENT_AMBULATORY_CARE_PROVIDER_SITE_OTHER): Payer: No Typology Code available for payment source | Admitting: Family Medicine

## 2020-11-19 VITALS — BP 126/68 | HR 82 | Temp 98.2°F | Resp 16 | Ht 67.0 in | Wt 172.6 lb

## 2020-11-19 DIAGNOSIS — Z8639 Personal history of other endocrine, nutritional and metabolic disease: Secondary | ICD-10-CM | POA: Diagnosis not present

## 2020-11-19 DIAGNOSIS — I10 Essential (primary) hypertension: Secondary | ICD-10-CM

## 2020-11-19 DIAGNOSIS — M25472 Effusion, left ankle: Secondary | ICD-10-CM | POA: Diagnosis not present

## 2020-11-19 DIAGNOSIS — M25471 Effusion, right ankle: Secondary | ICD-10-CM | POA: Diagnosis not present

## 2020-11-20 LAB — CBC WITH DIFFERENTIAL/PLATELET
Basophils Absolute: 0 10*3/uL (ref 0.0–0.2)
Basos: 0 %
EOS (ABSOLUTE): 0.2 10*3/uL (ref 0.0–0.4)
Eos: 3 %
Hematocrit: 44 % (ref 37.5–51.0)
Hemoglobin: 15.5 g/dL (ref 13.0–17.7)
Immature Grans (Abs): 0 10*3/uL (ref 0.0–0.1)
Immature Granulocytes: 0 %
Lymphocytes Absolute: 1.8 10*3/uL (ref 0.7–3.1)
Lymphs: 33 %
MCH: 31.4 pg (ref 26.6–33.0)
MCHC: 35.2 g/dL (ref 31.5–35.7)
MCV: 89 fL (ref 79–97)
Monocytes Absolute: 0.5 10*3/uL (ref 0.1–0.9)
Monocytes: 10 %
Neutrophils Absolute: 2.8 10*3/uL (ref 1.4–7.0)
Neutrophils: 54 %
Platelets: 158 10*3/uL (ref 150–450)
RBC: 4.94 x10E6/uL (ref 4.14–5.80)
RDW: 14.3 % (ref 11.6–15.4)
WBC: 5.3 10*3/uL (ref 3.4–10.8)

## 2020-11-20 LAB — COMPREHENSIVE METABOLIC PANEL
ALT: 25 IU/L (ref 0–44)
AST: 22 IU/L (ref 0–40)
Albumin/Globulin Ratio: 2.1 (ref 1.2–2.2)
Albumin: 5.2 g/dL — ABNORMAL HIGH (ref 4.0–5.0)
Alkaline Phosphatase: 75 IU/L (ref 44–121)
BUN/Creatinine Ratio: 21 — ABNORMAL HIGH (ref 9–20)
BUN: 21 mg/dL (ref 6–24)
Bilirubin Total: 0.6 mg/dL (ref 0.0–1.2)
CO2: 23 mmol/L (ref 20–29)
Calcium: 10 mg/dL (ref 8.7–10.2)
Chloride: 103 mmol/L (ref 96–106)
Creatinine, Ser: 0.98 mg/dL (ref 0.76–1.27)
Globulin, Total: 2.5 g/dL (ref 1.5–4.5)
Glucose: 82 mg/dL (ref 65–99)
Potassium: 4.4 mmol/L (ref 3.5–5.2)
Sodium: 142 mmol/L (ref 134–144)
Total Protein: 7.7 g/dL (ref 6.0–8.5)
eGFR: 95 mL/min/{1.73_m2} (ref >59)

## 2020-11-20 LAB — TSH: TSH: 1.26 u[IU]/mL (ref 0.450–4.500)

## 2020-12-11 ENCOUNTER — Ambulatory Visit (INDEPENDENT_AMBULATORY_CARE_PROVIDER_SITE_OTHER): Payer: No Typology Code available for payment source | Admitting: Family Medicine

## 2020-12-11 ENCOUNTER — Other Ambulatory Visit: Payer: Self-pay

## 2020-12-11 ENCOUNTER — Encounter: Payer: Self-pay | Admitting: Family Medicine

## 2020-12-11 VITALS — BP 114/72 | HR 79 | Temp 98.7°F | Wt 165.0 lb

## 2020-12-11 DIAGNOSIS — H04203 Unspecified epiphora, bilateral lacrimal glands: Secondary | ICD-10-CM

## 2020-12-11 DIAGNOSIS — H538 Other visual disturbances: Secondary | ICD-10-CM

## 2020-12-11 NOTE — Progress Notes (Signed)
Established patient visit   Patient: Russell Simon   DOB: [birth year]   50 y.o. Male  MRN: [id number] Visit Date: 12/11/2020  Today's healthcare provider: Dortha Kern, PA-C   No chief complaint on file.  Subjective    Eye Problem  The right eye is affected. The current episode started more than 1 month ago. The problem occurs constantly. There was no injury mechanism. The patient is experiencing no pain. Associated symptoms include blurred vision and an eye discharge. Pertinent negatives include no eye redness, fever, foreign body sensation, itching or photophobia. He has tried eye drops for the symptoms. The treatment provided no relief.       Patient Active Problem List   Diagnosis Date Noted   Chest pain 12/02/2019   Hypokalemia 12/02/2019   Generalized anxiety disorder 10/15/2018   Essential hypertension 04/03/2017   Low back pain 03/04/2015   Past Medical History:  Diagnosis Date   Hypertension    Past Surgical History:  Procedure Laterality Date   ADENOIDECTOMY     LEFT HEART CATH AND CORONARY ANGIOGRAPHY Left 01/21/2020   Procedure: LEFT HEART CATH AND CORONARY ANGIOGRAPHY prob PCI;  Surgeon: Marcina Millard, MD;  Location: [facility] INVASIVE CV LAB;  Service: Cardiovascular;  Laterality: Left;   TONSILLECTOMY AND ADENOIDECTOMY  1983    Social History   Tobacco Use   Smoking status: Never Smoker   Smokeless tobacco: Never Used  Substance Use Topics   Alcohol use: No    Alcohol/week: 0.0 standard drinks   Drug use: No   No Known Allergies   Medications: Outpatient Medications Prior to Visit  Medication Sig   amLODipine (NORVASC) 10 MG tablet Take 1 tablet (10 mg total) by mouth daily.   aspirin 81 MG EC tablet Take by mouth.   hydrochlorothiazide (HYDRODIURIL) 25 MG tablet TAKE 1 TABLET (25 MG TOTAL) BY MOUTH DAILY. *NEEDS OFFICE VISIT FOR MORE REFILLS*   isosorbide mononitrate (IMDUR) 60 MG 24 hr tablet Take 60 mg by mouth daily.    meloxicam (MOBIC) 15 MG tablet Take 15 mg by mouth daily as needed for pain.    nitroGLYCERIN (NITROSTAT) 0.4 MG SL tablet Place 1 tablet (0.4 mg total) under the tongue every 5 (five) minutes as needed for chest pain.   rosuvastatin (CRESTOR) 10 MG tablet Take 10 mg by mouth daily.   No facility-administered medications prior to visit.    Review of Systems  Constitutional: Negative.  Negative for fever.  Eyes: Positive for blurred vision and discharge. Negative for photophobia, pain, redness, itching and visual disturbance.       Objective    BP 114/72 (BP Location: Right Arm, Patient Position: Sitting, Cuff Size: Normal)   Pulse 79   Temp 98.7 F (37.1 C) (Oral)   Wt 165 lb (74.8 kg)   SpO2 99%   BMI 25.84 kg/m   Physical Exam Constitutional:      General: He is not in acute distress.    Appearance: He is well-developed.  HENT:     Head: Normocephalic and atraumatic.     Right Ear: Hearing normal.     Left Ear: Hearing normal.     Nose: Nose normal.     Mouth/Throat:     Pharynx: Oropharynx is clear.  Eyes:     General: Lids are normal. No scleral icterus.       Right eye: No discharge.        Left eye: No discharge.  Extraocular Movements: Extraocular movements intact.     Conjunctiva/sclera: Conjunctivae normal.     Pupils: Pupils are equal, round, and reactive to light.     Comments: No erythema or discharge at the present.  Pulmonary:     Effort: Pulmonary effort is normal. No respiratory distress.  Musculoskeletal:        General: Normal range of motion.  Skin:    Findings: No lesion or rash.  Neurological:     Mental Status: He is alert and oriented to person, place, and time.  Psychiatric:        Speech: Speech normal.        Behavior: Behavior normal.        Thought Content: Thought content normal.     No results found for any visits on 12/11/20.  Assessment & Plan     1. Watery eyes Onset with blurring of vision and crusting in the corner of  the right eye over the past 1-1.5 months. No relief from Visine Allergy Drops. May try to add Flonase Nasal spray each night. If no better in the next week, recommend ophthalmology evaluation. - Ambulatory referral to Ophthalmology  2. Blurring of vision - Ambulatory referral to Ophthalmology   No follow-ups on file.      I, Antero Derosia, PA-C, have reviewed all documentation for this visit. The documentation on 12/11/20 for the exam, diagnosis, procedures, and orders are all accurate and complete.    Dortha Kern, PA-C  [facility] [phone] (phone) [phone] (fax)  [facility]

## 2020-12-14 ENCOUNTER — Ambulatory Visit: Payer: No Typology Code available for payment source | Admitting: Family Medicine

## 2020-12-30 ENCOUNTER — Telehealth: Payer: Self-pay

## 2020-12-30 DIAGNOSIS — I10 Essential (primary) hypertension: Secondary | ICD-10-CM

## 2020-12-30 MED ORDER — AMLODIPINE BESYLATE 10 MG PO TABS
10.0000 mg | ORAL_TABLET | Freq: Every day | ORAL | 1 refills | Status: DC
Start: 2020-12-30 — End: 2022-07-15

## 2020-12-30 NOTE — Telephone Encounter (Signed)
[facility] is [facility] ref[facility] on Amlodipione Besylate 10 mg. #90

## 2020-12-30 NOTE — Telephone Encounter (Signed)
Rx sent to pharmacy   

## 2021-01-04 ENCOUNTER — Other Ambulatory Visit: Payer: Self-pay | Admitting: Family Medicine

## 2021-01-04 DIAGNOSIS — I10 Essential (primary) hypertension: Secondary | ICD-10-CM

## 2021-01-08 IMAGING — CR DG CHEST 2V
1 series · 2 of 2 positions shown · non-contrast
Comparison: 08/03/2018

CLINICAL DATA: Substernal chest tightness with shortness of breath
since last night, diagnosed with COVID 1 month ago, history
hypertension

EXAM:
CHEST - 2 VIEW

[Series 1: dg chest 2 view · 0.14mm/px · 2 of 2 slices shown]
[im 1/2]
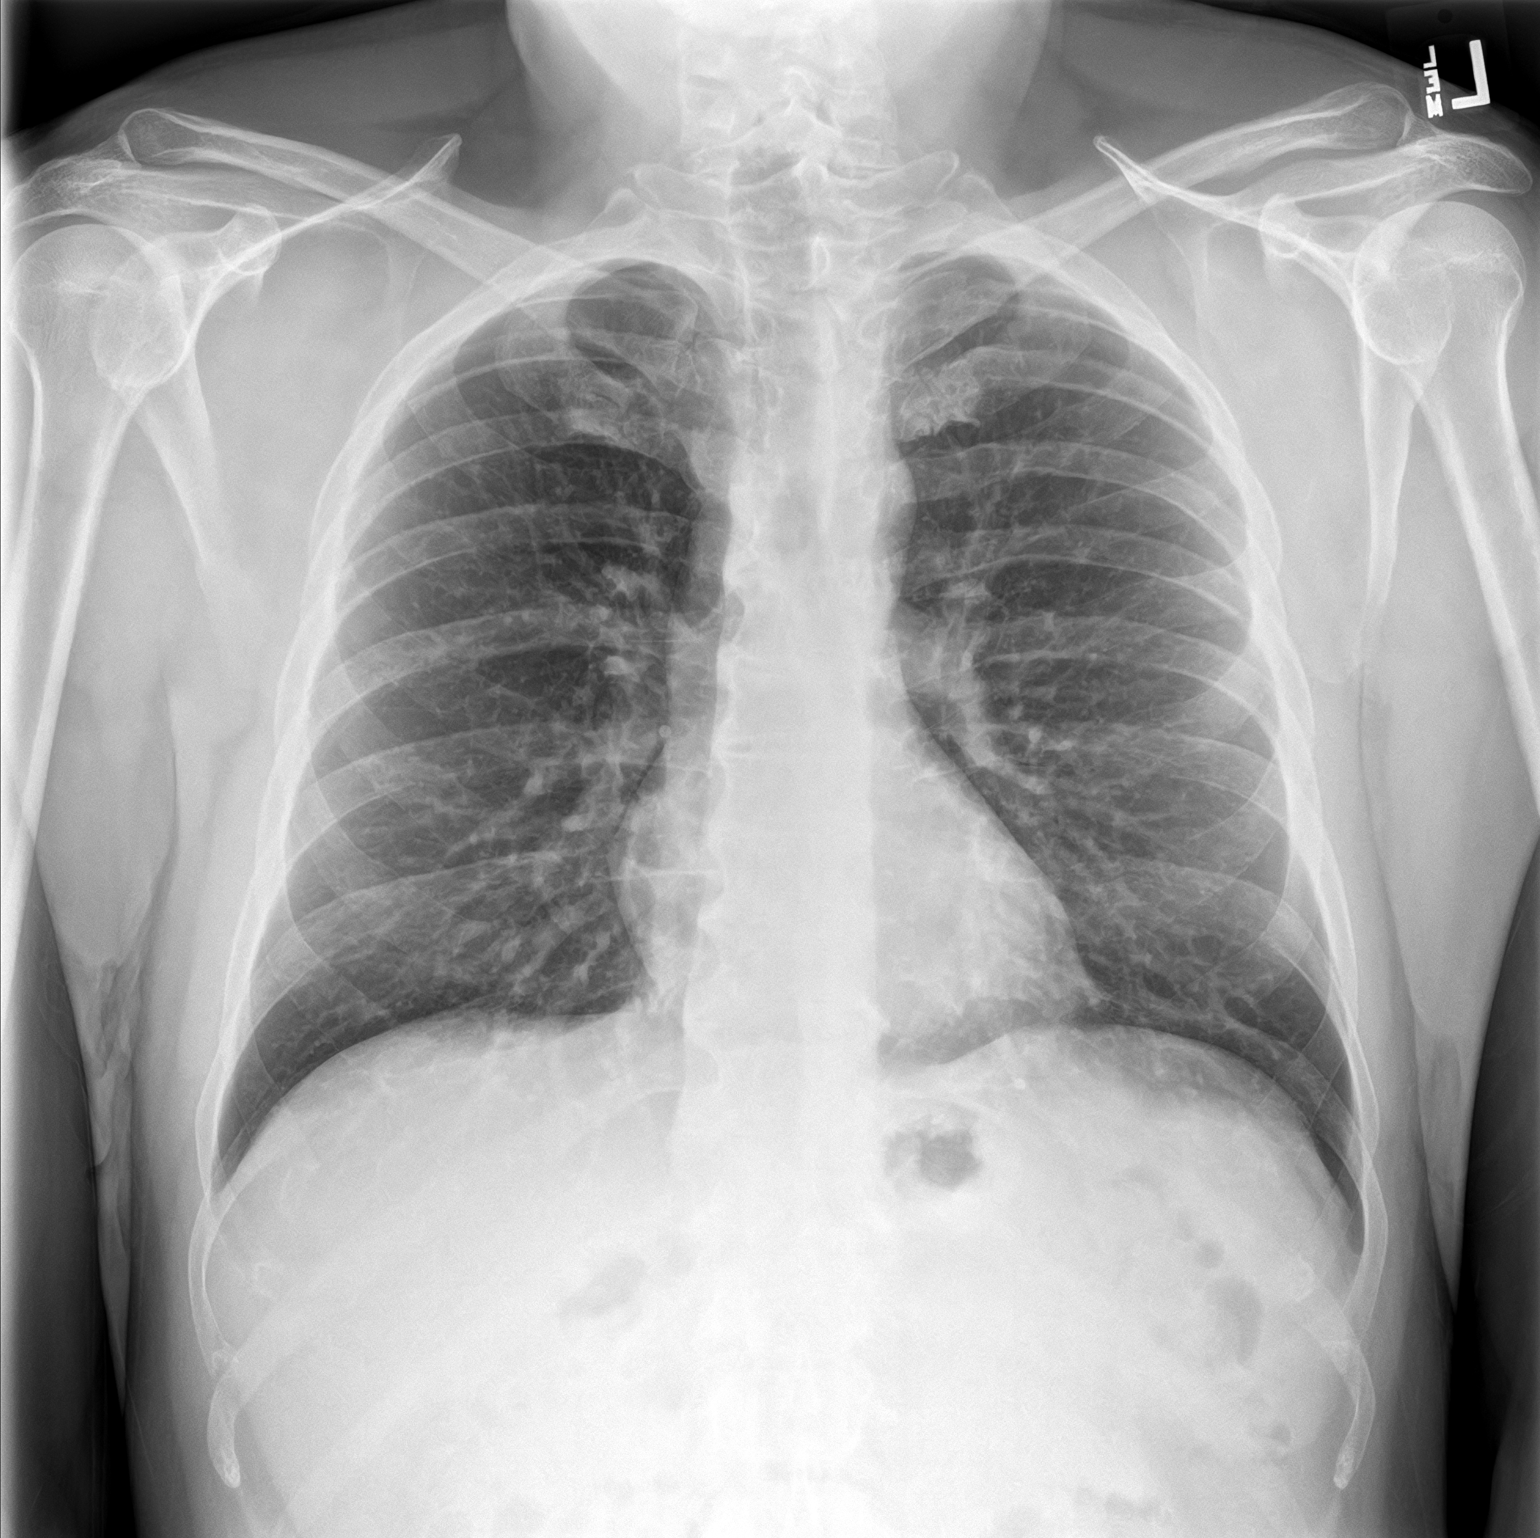
[im 2/2]
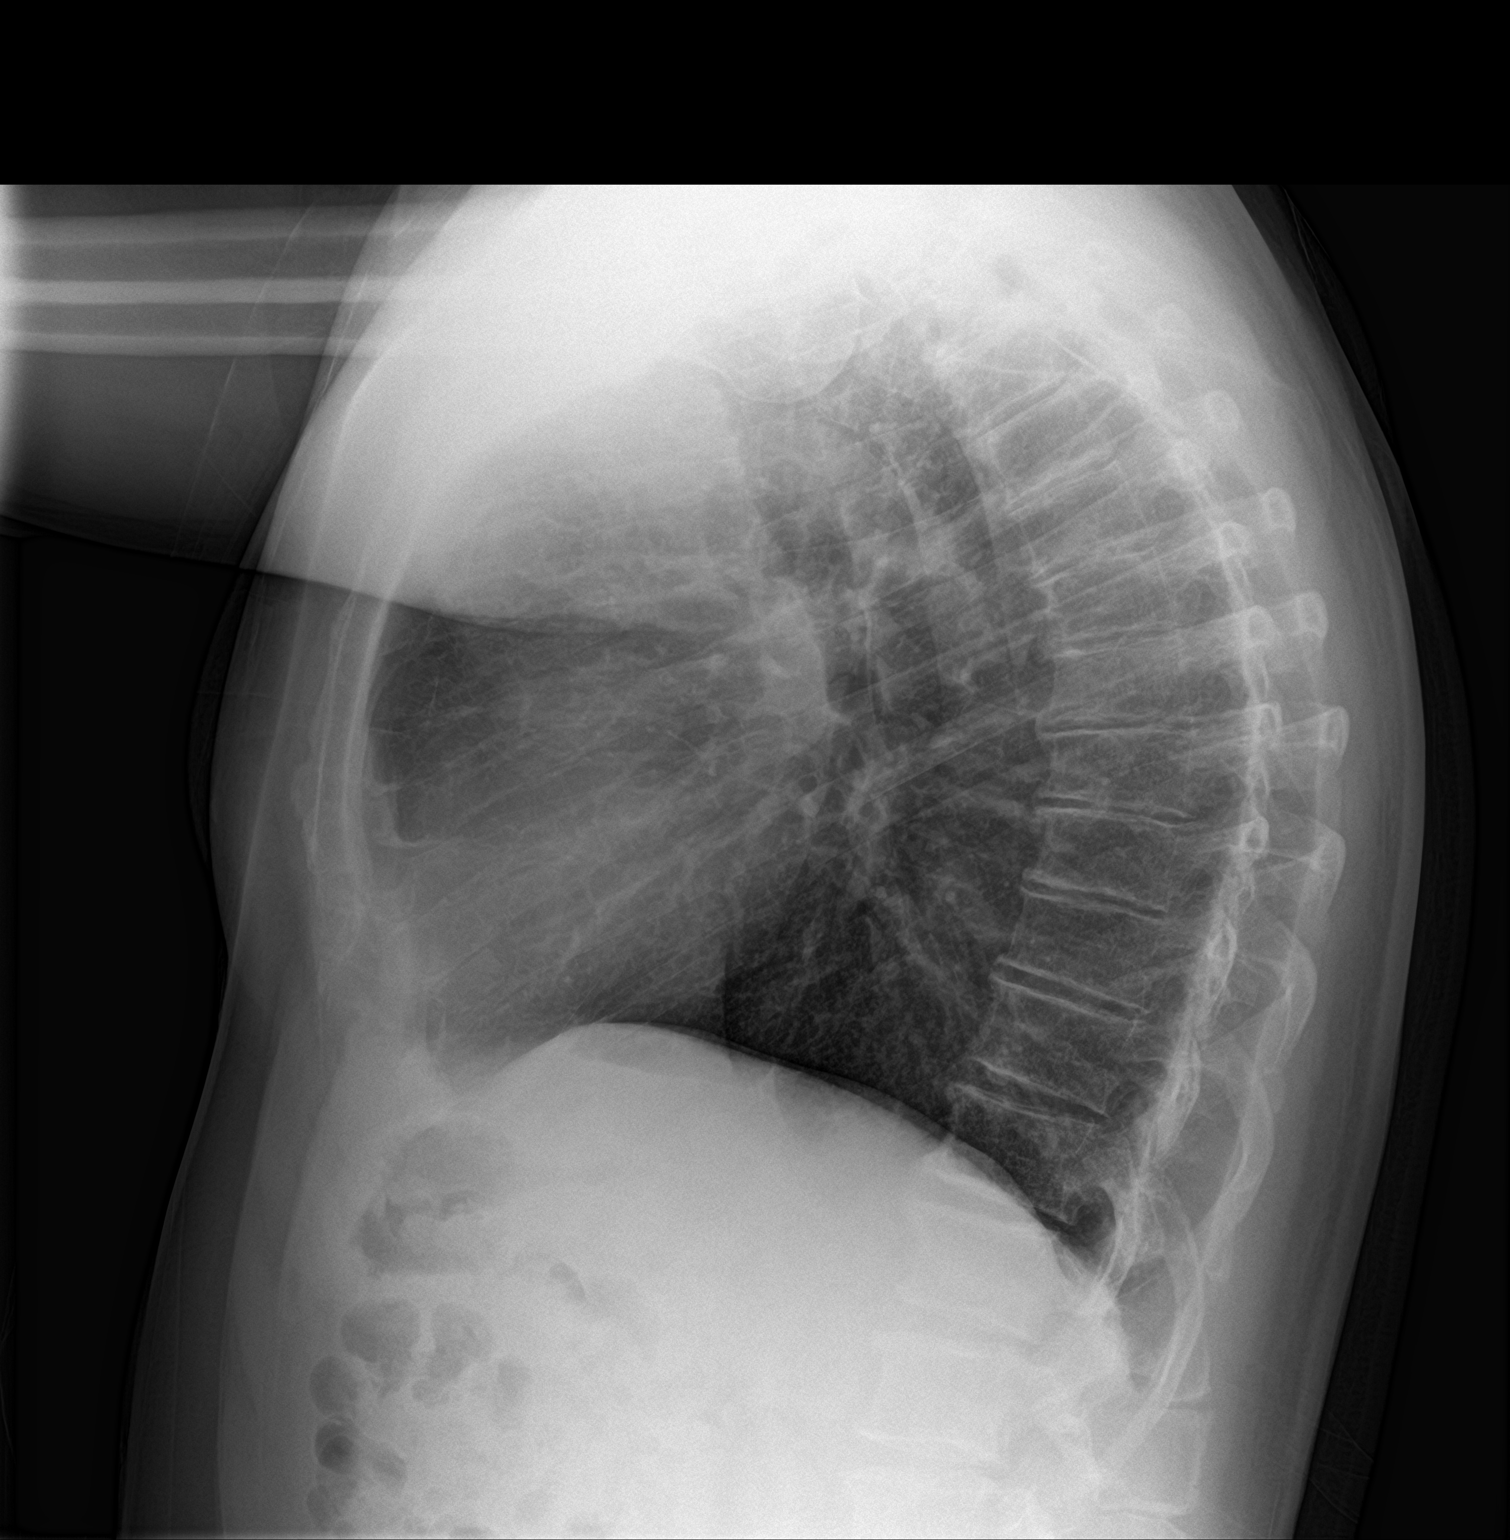

[2 of 2 positions shown; findings below may reference images not displayed]

FINDINGS: Normal heart size, mediastinal contours, and pulmonary vascularity.

Lungs clear.

No pleural effusion or pneumothorax.

Bones unremarkable.
IMPRESSION: Normal exam.

## 2021-04-13 ENCOUNTER — Ambulatory Visit (INDEPENDENT_AMBULATORY_CARE_PROVIDER_SITE_OTHER): Payer: No Typology Code available for payment source | Admitting: Family Medicine

## 2021-04-13 ENCOUNTER — Encounter: Payer: Self-pay | Admitting: Family Medicine

## 2021-04-13 ENCOUNTER — Other Ambulatory Visit: Payer: Self-pay

## 2021-04-13 VITALS — BP 124/79 | HR 73 | Temp 98.8°F | Resp 16 | Ht 67.0 in | Wt 166.0 lb

## 2021-04-13 DIAGNOSIS — M67432 Ganglion, left wrist: Secondary | ICD-10-CM | POA: Diagnosis not present

## 2021-04-13 NOTE — Progress Notes (Signed)
I,April Miller,acting as a scribe for Megan Mans, MD.,have documented all relevant documentation on the behalf of Megan Mans, MD,as directed by  Megan Mans, MD while in the presence of Megan Mans, MD.   Established patient visit   Patient: Russell Simon   DOB: [birth year]   50 y.o. Male  MRN: [id number] Visit Date: 04/13/2021  Today's healthcare provider: Megan Mans, MD   No chief complaint on file.  Subjective    HPI  Patient is here concerning 2 knots on the inside of his left wrist. Patient states he noticed them last week. Knots are not painful.  No problems moving the hand or with any pain.  No known trauma.    Medications: Outpatient Medications Prior to Visit  Medication Sig   amLODipine (NORVASC) 10 MG tablet Take 1 tablet (10 mg total) by mouth daily.   aspirin 81 MG EC tablet Take by mouth.   hydrochlorothiazide (HYDRODIURIL) 25 MG tablet TAKE 1 TABLET (25 MG TOTAL) BY MOUTH DAILY. *NEEDS OFFICE VISIT FOR MORE REFILLS*   isosorbide mononitrate (IMDUR) 60 MG 24 hr tablet Take 60 mg by mouth daily.   meloxicam (MOBIC) 15 MG tablet Take 15 mg by mouth daily as needed for pain.    nitroGLYCERIN (NITROSTAT) 0.4 MG SL tablet Place 1 tablet (0.4 mg total) under the tongue every 5 (five) minutes as needed for chest pain.   rosuvastatin (CRESTOR) 10 MG tablet Take 10 mg by mouth daily.   No facility-administered medications prior to visit.    Review of Systems      Objective    BP 124/79 (BP Location: Right Arm, Patient Position: Sitting, Cuff Size: Large)   Pulse 73   Temp 98.8 F (37.1 C) (Oral)   Resp 16   Ht 5\' 7"  (1.702 m)   Wt 166 lb (75.3 kg)   SpO2 97%   BMI 26.00 kg/m  BP Readings from Last 3 Encounters:  04/13/21 124/79  12/11/20 114/72  11/19/20 126/68   Wt Readings from Last 3 Encounters:  04/13/21 166 lb (75.3 kg)  12/11/20 165 lb (74.8 kg)  11/19/20 172 lb 9.6 oz (78.3 kg)      Physical  Exam Vitals reviewed.  Constitutional:      General: He is not in acute distress.    Appearance: He is well-developed.  HENT:     Head: Normocephalic and atraumatic.     Right Ear: Hearing normal.     Left Ear: Hearing normal.     Nose: Nose normal.  Eyes:     General: Lids are normal. No scleral icterus.       Right eye: No discharge.        Left eye: No discharge.     Conjunctiva/sclera: Conjunctivae normal.  Cardiovascular:     Rate and Rhythm: Normal rate and regular rhythm.     Heart sounds: Normal heart sounds.  Pulmonary:     Effort: Pulmonary effort is normal. No respiratory distress.  Musculoskeletal:     Comments: Probable ganglion cyst of the left palmar wrist.  This is over the radial artery.  Skin:    Findings: No lesion or rash.  Neurological:     General: No focal deficit present.     Mental Status: He is alert and oriented to person, place, and time.  Psychiatric:        Mood and Affect: Mood normal.  Speech: Speech normal.        Behavior: Behavior normal.        Thought Content: Thought content normal.        Judgment: Judgment normal.      No results found for any visits on 04/13/21.  Assessment & Plan     1. Ganglion cyst of wrist, left Would wear wrist splint until he is seen by orthopedic surgery - Ambulatory referral to Orthopedic Surgery   No follow-ups on file.      I, Megan Mans, MD, have reviewed all documentation for this visit. The documentation on 04/17/21 for the exam, diagnosis, procedures, and orders are all accurate and complete.    Sharod Petsch Wendelyn Breslow, MD  [facility] [phone] (phone) [phone] (fax)  [facility]

## 2021-04-13 NOTE — Patient Instructions (Signed)
TRY WEARING A WRIST SPLINT ON LEFT WRIST.

## 2021-05-30 ENCOUNTER — Other Ambulatory Visit: Payer: Self-pay | Admitting: Family Medicine

## 2021-05-30 DIAGNOSIS — I10 Essential (primary) hypertension: Secondary | ICD-10-CM

## 2021-05-30 NOTE — Telephone Encounter (Signed)
last RF 12/30/20 #90 1 RF

## 2021-05-31 ENCOUNTER — Telehealth: Payer: Self-pay | Admitting: Family Medicine

## 2021-05-31 ENCOUNTER — Other Ambulatory Visit: Payer: Self-pay

## 2021-05-31 DIAGNOSIS — I10 Essential (primary) hypertension: Secondary | ICD-10-CM

## 2021-05-31 MED ORDER — HYDROCHLOROTHIAZIDE 25 MG PO TABS: ORAL_TABLET | ORAL | 0 refills | Status: DC

## 2021-05-31 NOTE — Telephone Encounter (Signed)
[facility] for the following medications:  hydrochlorothiazide (HYDRODIURIL) 25 MG tablet  Please advise.  

## 2021-07-06 ENCOUNTER — Other Ambulatory Visit: Payer: Self-pay | Admitting: Family Medicine

## 2021-07-06 DIAGNOSIS — I10 Essential (primary) hypertension: Secondary | ICD-10-CM

## 2021-07-28 ENCOUNTER — Emergency Department: Payer: No Typology Code available for payment source

## 2021-07-28 ENCOUNTER — Emergency Department
Admission: EM | Admit: 2021-07-28 | Discharge: 2021-07-28 | Disposition: A | Discharge status: Home / Self Care | Payer: No Typology Code available for payment source | Attending: Emergency Medicine | Admitting: Emergency Medicine

## 2021-07-28 ENCOUNTER — Ambulatory Visit: Payer: Self-pay | Admitting: *Deleted

## 2021-07-28 ENCOUNTER — Encounter: Payer: Self-pay | Admitting: Family Medicine

## 2021-07-28 DIAGNOSIS — J069 Acute upper respiratory infection, unspecified: Secondary | ICD-10-CM | POA: Insufficient documentation

## 2021-07-28 DIAGNOSIS — R059 Cough, unspecified: Secondary | ICD-10-CM | POA: Diagnosis present

## 2021-07-28 DIAGNOSIS — Z20822 Contact with and (suspected) exposure to covid-19: Secondary | ICD-10-CM | POA: Insufficient documentation

## 2021-07-28 LAB — CBC
HCT: 44.6 % (ref 39.0–52.0)
Hemoglobin: 15.4 g/dL (ref 13.0–17.0)
MCH: 30.1 pg (ref 26.0–34.0)
MCHC: 34.5 g/dL (ref 30.0–36.0)
MCV: 87.1 fL (ref 80.0–100.0)
Platelets: 147 10*3/uL — ABNORMAL LOW (ref 150–400)
RBC: 5.12 MIL/uL (ref 4.22–5.81)
RDW: 12.8 % (ref 11.5–15.5)
WBC: 4.7 10*3/uL (ref 4.0–10.5)
nRBC: 0 % (ref 0.0–0.2)

## 2021-07-28 LAB — BASIC METABOLIC PANEL
Anion gap: 9 (ref 5–15)
BUN: 14 mg/dL (ref 6–20)
CO2: 23 mmol/L (ref 22–32)
Calcium: 9.8 mg/dL (ref 8.9–10.3)
Chloride: 104 mmol/L (ref 98–111)
Creatinine, Ser: 0.85 mg/dL (ref 0.61–1.24)
GFR, Estimated: >60 mL/min (ref >60)
Glucose, Bld: 107 mg/dL — ABNORMAL HIGH (ref 70–99)
Potassium: 3.6 mmol/L (ref 3.5–5.1)
Sodium: 136 mmol/L (ref 135–145)

## 2021-07-28 LAB — TROPONIN I (HIGH SENSITIVITY): Troponin I (High Sensitivity): 3 ng/L (ref <18)

## 2021-07-28 LAB — RESP PANEL BY RT-PCR (FLU A&B, COVID) ARPGX2
Influenza A by PCR: NEGATIVE
Influenza B by PCR: NEGATIVE
SARS Coronavirus 2 by RT PCR: NEGATIVE

## 2021-07-28 NOTE — ED Triage Notes (Signed)
Pt reports yesterday he began having cold sx's with body aches, chest congestion and cough. Pt denies chest pain.

## 2021-07-28 NOTE — Telephone Encounter (Signed)
° °  Chief Complaint: chest tightness/ pressure / fullness Symptoms: chest fullness, bodyaches, chills Frequency: started this weekend worsening fullness Pertinent Negatives: Patient denies difficulty breathing, sweating, dizziness, N/V, fever Disposition: [x] ED /[] Urgent Care (no appt availability in office) / [] Appointment(In office/virtual)/ []  [place] [facility]/ [] Home Care/ [] Refused Recommended Disposition /[] [place] [facility]/ []  Follow-up with PCP Additional Notes:  Sent home from work due to sx       Reason for Disposition  [1] Chest pain lasts > 5 minutes AND [2] occurred in past 3 days (72 hours) (Exception: feels exactly the same as previously diagnosed heartburn and has accompanying sour taste in mouth)  Answer Assessment - Initial Assessment Questions 1. LOCATION: "Where does it hurt?"       Middle of chest  2. RADIATION: "Does the pain go anywhere else?" (e.g., into neck, jaw, arms, back)     no 3. ONSET: "When did the chest pain begin?" (Minutes, hours or days)      Over the weekend  4. PATTERN "Does the pain come and go, or has it been constant since it started?"  "Does it get worse with exertion?"      Fullness , constant  with exertion 5. DURATION: "How long does it last" (e.g., seconds, minutes, hours)     A few hours  6. SEVERITY: "How bad is the pain?"  (e.g., Scale 1-10; mild, moderate, or severe)    - MILD (1-3): doesn't interfere with normal activities     - MODERATE (4-7): interferes with normal activities or awakens from sleep    - SEVERE (8-10): excruciating pain, unable to do any normal activities       Moderate  7. CARDIAC RISK FACTORS: "Do you have any history of heart problems or risk factors for heart disease?" (e.g., angina, prior heart attack; diabetes, high blood pressure, high cholesterol, smoker, or strong family history of heart disease)     HTN,  8. PULMONARY RISK FACTORS: "Do you have any history of lung disease?"  (e.g., blood  clots in lung, asthma, emphysema, birth control pills)     na 9. CAUSE: "What do you think is causing the chest pain?"     Not sure  10. OTHER SYMPTOMS: "Do you have any other symptoms?" (e.g., dizziness, nausea, vomiting, sweating, fever, difficulty breathing, cough)       Pressure  11. PREGNANCY: "Is there any chance you are pregnant?" "When was your last menstrual period?"       na  Protocols used: Chest Pain-A-AH

## 2021-07-28 NOTE — ED Provider Triage Note (Signed)
Emergency Medicine Provider Triage Evaluation Note  Russell Simon , a 51 y.o. male  was evaluated in triage.  Pt complains of feeling "full" in chest , body aches, and BP was elevated. History of HTN and has been compliant with medications. This morning has chest pressure "like congestion." Called PCP and advised to come to the ER. No fever or cough.   Review of Systems  Positive: Chest pressure, body aches Negative: Fever  Physical Exam  There were no vitals taken for this visit. Gen:   Awake, no distress   Resp:  Normal effort  MSK:   Moves extremities without difficulty  Other:    Medical Decision Making  Medically screening exam initiated at 11:34 AM.  Appropriate orders placed.  Inis Sizer was informed that the remainder of the evaluation will be completed by another provider, this initial triage assessment does not replace that evaluation, and the importance of remaining in the ED until their evaluation is complete.   Chinita Pester, FNP 07/28/21 1147

## 2021-07-30 ENCOUNTER — Other Ambulatory Visit: Payer: Self-pay | Admitting: Family Medicine

## 2021-07-30 DIAGNOSIS — I10 Essential (primary) hypertension: Secondary | ICD-10-CM

## 2021-07-30 NOTE — Telephone Encounter (Signed)
Requested Prescriptions  Pending Prescriptions Disp Refills   hydrochlorothiazide (HYDRODIURIL) 25 MG tablet [Pharmacy Med Name: HYDROCHLOROTHIAZIDE 25 MG TAB] 30 tablet 0    Sig: TAKE 1 TABLET (25 MG TOTAL) BY MOUTH DAILY.     Cardiovascular: Diuretics - Thiazide Failed - 07/30/2021 10:35 AM      Failed - Last BP in normal range    BP Readings from Last 1 Encounters:  07/28/21 (!) 134/96         Passed - Ca in normal range and within 360 days    Calcium  Date Value Ref Range Status  07/28/2021 9.8 8.9 - 10.3 mg/dL Final         Passed - Cr in normal range and within 360 days    Creat  Date Value Ref Range Status  04/03/2017 1.18 0.60 - 1.35 mg/dL Final   Creatinine, Ser  Date Value Ref Range Status  07/28/2021 0.85 0.61 - 1.24 mg/dL Final         Passed - K in normal range and within 360 days    Potassium  Date Value Ref Range Status  07/28/2021 3.6 3.5 - 5.1 mmol/L Final         Passed - Na in normal range and within 360 days    Sodium  Date Value Ref Range Status  07/28/2021 136 135 - 145 mmol/L Final  11/19/2020 142 134 - 144 mmol/L Final         Passed - Valid encounter within last 6 months    Recent Outpatient Visits          3 months ago Ganglion cyst of wrist, left   [facility] Maple Hudson., MD   7 months ago [facility] eyes   [facility], Jodell Cipro, PA-C   8 months ago Swelling of both ankles   [employer], Jodell Cipro, PA-C   10 months ago Essential hypertension   [facility] Osvaldo Angst M, [place]   10 months ago Primary hypertension   [facility] [place], [place], [place]

## 2021-08-01 ENCOUNTER — Other Ambulatory Visit: Payer: Self-pay

## 2021-08-01 ENCOUNTER — Encounter: Payer: Self-pay | Admitting: Emergency Medicine

## 2021-08-01 DIAGNOSIS — M7918 Myalgia, other site: Secondary | ICD-10-CM | POA: Diagnosis not present

## 2021-08-01 DIAGNOSIS — Z20822 Contact with and (suspected) exposure to covid-19: Secondary | ICD-10-CM | POA: Diagnosis not present

## 2021-08-01 DIAGNOSIS — I10 Essential (primary) hypertension: Secondary | ICD-10-CM | POA: Diagnosis not present

## 2021-08-01 DIAGNOSIS — R531 Weakness: Secondary | ICD-10-CM | POA: Insufficient documentation

## 2021-08-01 DIAGNOSIS — R5383 Other fatigue: Secondary | ICD-10-CM | POA: Diagnosis not present

## 2021-08-01 NOTE — ED Triage Notes (Signed)
Pt c/o bodyaches, weakness, and chills, pt seen for same x 2 days ago

## 2021-08-02 ENCOUNTER — Emergency Department
Admission: EM | Admit: 2021-08-02 | Discharge: 2021-08-02 | Disposition: A | Discharge status: Home / Self Care | Payer: No Typology Code available for payment source | Attending: Emergency Medicine | Admitting: Emergency Medicine

## 2021-08-02 DIAGNOSIS — R531 Weakness: Secondary | ICD-10-CM

## 2021-08-02 LAB — URINALYSIS, COMPLETE (UACMP) WITH MICROSCOPIC
Bacteria, UA: NONE SEEN
Bilirubin Urine: NEGATIVE
Glucose, UA: NEGATIVE mg/dL
Hgb urine dipstick: NEGATIVE
Ketones, ur: NEGATIVE mg/dL
Leukocytes,Ua: NEGATIVE
Nitrite: NEGATIVE
Protein, ur: NEGATIVE mg/dL
Specific Gravity, Urine: 1.018 (ref 1.005–1.030)
Squamous Epithelial / HPF: NONE SEEN (ref 0–5)
pH: 8 (ref 5.0–8.0)

## 2021-08-02 LAB — COMPREHENSIVE METABOLIC PANEL
ALT: 39 U/L (ref 0–44)
AST: 30 U/L (ref 15–41)
Albumin: 5 g/dL (ref 3.5–5.0)
Alkaline Phosphatase: 76 U/L (ref 38–126)
Anion gap: 7 (ref 5–15)
BUN: 16 mg/dL (ref 6–20)
CO2: 26 mmol/L (ref 22–32)
Calcium: 9.6 mg/dL (ref 8.9–10.3)
Chloride: 104 mmol/L (ref 98–111)
Creatinine, Ser: 0.73 mg/dL (ref 0.61–1.24)
GFR, Estimated: >60 mL/min (ref >60)
Glucose, Bld: 102 mg/dL — ABNORMAL HIGH (ref 70–99)
Potassium: 3.6 mmol/L (ref 3.5–5.1)
Sodium: 137 mmol/L (ref 135–145)
Total Bilirubin: 1.2 mg/dL (ref 0.3–1.2)
Total Protein: 8.1 g/dL (ref 6.5–8.1)

## 2021-08-02 LAB — CBC
HCT: 47.3 % (ref 39.0–52.0)
Hemoglobin: 16.5 g/dL (ref 13.0–17.0)
MCH: 30.2 pg (ref 26.0–34.0)
MCHC: 34.9 g/dL (ref 30.0–36.0)
MCV: 86.5 fL (ref 80.0–100.0)
Platelets: 154 10*3/uL (ref 150–400)
RBC: 5.47 MIL/uL (ref 4.22–5.81)
RDW: 12.7 % (ref 11.5–15.5)
WBC: 5.1 10*3/uL (ref 4.0–10.5)
nRBC: 0 % (ref 0.0–0.2)

## 2021-08-02 LAB — RESP PANEL BY RT-PCR (FLU A&B, COVID) ARPGX2
Influenza A by PCR: NEGATIVE
Influenza B by PCR: NEGATIVE
SARS Coronavirus 2 by RT PCR: NEGATIVE

## 2021-08-02 LAB — CK: Total CK: 170 U/L (ref 49–397)

## 2021-08-02 LAB — TROPONIN I (HIGH SENSITIVITY): Troponin I (High Sensitivity): 3 ng/L (ref <18)

## 2021-08-02 NOTE — ED Provider Notes (Signed)
° °  [facility] Provider Note    Event Date/Time   First MD Initiated Contact with Patient 08/02/21 0153     (approximate)  History   Chief Complaint: No chief complaint on file.  HPI  Russell Simon is a 50 y.o. male with a past medical history of hypertension presents to the emergency department for generalized body aches fatigue and weakness.  According to the patient over the past 1 week he has been experiencing generalized body aches fatigue and feeling very weak.  Patient denies any chest pain.  Denies any fever.  Has not been coughing and does not feel congested.  Patient was seen in the emergency department 07/28/2021 for the same with a negative/reassuring work-up.  Patient states he was at work tonight and due to the fatigue he was told to go to the emergency department for evaluation.  Physical Exam   Triage Vital Signs: ED Triage Vitals  Enc Vitals Group     BP 08/01/21 2326 (!) 145/87     Pulse Rate 08/01/21 2326 84     Resp 08/01/21 2326 17     Temp 08/01/21 2326 97.9 F (36.6 C)     Temp Source 08/01/21 2326 Oral     SpO2 08/01/21 2326 99 %     Weight 08/01/21 2322 170 lb (77.1 kg)     Height 08/01/21 2322 5\' 7"  (1.702 m)     Head Circumference --      Peak Flow --      Pain Score 08/01/21 2322 3     Pain Loc --      Pain Edu? --      Excl. in GC? --     Most recent vital signs: Vitals:   08/01/21 2326  BP: (!) 145/87  Pulse: 84  Resp: 17  Temp: 97.9 F (36.6 C)  SpO2: 99%    General: Awake, no distress.  CV:  Good peripheral perfusion.  Regular rate and rhythm  Resp:  Normal effort.  Equal breath sounds bilaterally.  Abd:  No distention.  Soft, nontender.  No rebound or guarding.      MEDICATIONS ORDERED IN ED: Medications - No data to display   IMPRESSION / MDM / ASSESSMENT AND PLAN / ED COURSE  I reviewed the triage vital signs and the nursing notes.  Patient presents emergency department for fatigue and  weakness over the past 1 week.  I reviewed the patient's chart including his visit 07/28/2021 with overall reassuring results.  Patient's physical exam is reassuring.  Vital signs are reassuring.  No concerning findings on examination or history.  However given the patient's 1 week of symptoms we will check labs as a precaution EKG and cardiac enzyme.  Patient agreeable to plan of care.  COVID and flu are negative.  Patient's work-up is reassuring.  CK is normal.  Troponin negative.  Urinalysis normal.  COVID and flu negative.  Reassuring chemistry and CBC.  Given the patient's reassuring work-up I believe the patient is safe for discharge home with PCP follow-up as needed.  Patient agreeable to plan of care.  FINAL CLINICAL IMPRESSION(S) / ED DIAGNOSES   Weakness Fatigue  Note:  This document was prepared using Dragon voice recognition software and may include unintentional dictation errors.   09/25/2021, MD 08/02/21 [phone]

## 2021-08-03 NOTE — ED Provider Notes (Signed)
[facility] Provider Note    Event Date/Time   First MD Initiated Contact with Patient 07/28/21 1358     (approximate)   History   Generalized Body Aches, Cough, and Nasal Congestion   HPI  Russell Simon is a 51 y.o. male who presents with complaints of body aches and chest discomfort, occasional cough and elevated blood pressure.  Reports his blood pressure has been higher than typical, does report compliance with medications     Physical Exam   Triage Vital Signs: ED Triage Vitals  Enc Vitals Group     BP 07/28/21 1137 (!) 139/96     Pulse Rate 07/28/21 1137 86     Resp 07/28/21 1137 18     Temp 07/28/21 1137 98.3 F (36.8 C)     Temp Source 07/28/21 1137 Oral     SpO2 07/28/21 1137 99 %     Weight 07/28/21 1138 77.1 kg (170 lb)     Height 07/28/21 1138 1.702 m (5\' 7" )     Head Circumference --      Peak Flow --      Pain Score 07/28/21 1137 2     Pain Loc --      Pain Edu? --      Excl. in GC? --     Most recent vital signs: Vitals:   07/28/21 1137 07/28/21 1442  BP: (!) 139/96 (!) 134/96  Pulse: 86 72  Resp: 18 16  Temp: 98.3 F (36.8 C)   SpO2: 99% 98%     General: Awake, no distress.  CV:  Good peripheral perfusion.  Regular rate and rhythm Resp:  Normal effort.  Clear to auscultation bilaterally Abd:  No distention.  Other:  No edema   ED Results / Procedures / Treatments   Labs (all labs ordered are listed, but only abnormal results are displayed) Labs Reviewed  BASIC METABOLIC PANEL - Abnormal; Notable for the following components:      Result Value   Glucose, Bld 107 (*)    All other components within normal limits  CBC - Abnormal; Notable for the following components:   Platelets 147 (*)    All other components within normal limits  RESP PANEL BY RT-PCR (FLU A&B, COVID) [id number]  TROPONIN I (HIGH SENSITIVITY)     EKG  ED ECG REPORT I, 09/25/21, the attending physician, personally viewed and  interpreted this ECG.  Date: 08/03/2021  Rhythm: normal sinus rhythm QRS Axis: normal Intervals: normal ST/T Wave abnormalities: normal Narrative Interpretation: no evidence of acute ischemia    RADIOLOGY Chest x-ray reviewed by me, no acute abnormality    PROCEDURES:  Critical Care performed:   Procedures   MEDICATIONS ORDERED IN ED: Medications - No data to display   IMPRESSION / MDM / ASSESSMENT AND PLAN / ED COURSE  I reviewed the triage vital signs and the nursing notes.   Patient overall well-appearing and in no acute distress.  Blood pressure is only mildly elevated.  His lab work today including his influenza and COVID PCR negative  His CBC high-sensitivity troponin and BNP are normal  EKG is unremarkable, no concerns for ACS  Chest x-ray without evidence of pneumonia.  Given his description I suspect he is suffering viral illness, recommend supportive care, outpatient follow-up for continued blood pressure monitoring          FINAL CLINICAL IMPRESSION(S) / ED DIAGNOSES   Final diagnoses:  Viral URI with cough  Rx / DC Orders   ED Discharge Orders     None        Note:  This document was prepared using Dragon voice recognition software and may include unintentional dictation errors.   Jene Every, MD 08/03/21 [phone]

## 2021-11-04 ENCOUNTER — Other Ambulatory Visit: Payer: Self-pay | Admitting: Family Medicine

## 2021-11-04 DIAGNOSIS — I10 Essential (primary) hypertension: Secondary | ICD-10-CM

## 2021-11-05 NOTE — Telephone Encounter (Signed)
Courtesy refill. Called patient to schedule appt for med. Refills. No answer, LVMTCB.  ?Requested Prescriptions  ?Pending Prescriptions Disp Refills  ?? hydrochlorothiazide (HYDRODIURIL) 25 MG tablet [Pharmacy Med Name: HYDROCHLOROTHIAZIDE 25 MG TAB] 90 tablet 0  ?  Sig: TAKE 1 TABLET (25 MG TOTAL) BY MOUTH DAILY.  ?  ? Cardiovascular: Diuretics - Thiazide Failed - 11/04/2021  8:41 PM  ?  ?  Failed - Last BP in normal range  ?  BP Readings from Last 1 Encounters:  ?08/01/21 (!) 145/87  ?   ?  ?  Failed - Valid encounter within last 6 months  ?  Recent Outpatient Visits   ?      ? 6 months ago Ganglion cyst of wrist, left  ? [facility] Jerrol Banana., MD  ? 10 months ago Watery eyes  ? [place], [place]  ? 11 months ago Swelling of both ankles  ? [place], PA-C  ? 1 year ago Essential hypertension  ? [facility] [place], [place], [place]  ? 1 year ago Primary hypertension  ? [place], [place]  ?  ?  ? ?  ?  ?  Passed - Cr in normal range and within 180 days  ?  Creat  ?Date Value Ref Range Status  ?04/03/2017 1.18 0.60 - 1.35 mg/dL Final  ? ?Creatinine, Ser  ?Date Value Ref Range Status  ?08/02/2021 0.73 0.61 - 1.24 mg/dL Final  ?   ?  ?  Passed - K in normal range and within 180 days  ?  Potassium  ?Date Value Ref Range Status  ?08/02/2021 3.6 3.5 - 5.1 mmol/L Final  ?   ?  ?  Passed - Na in normal range and within 180 days  ?  Sodium  ?Date Value Ref Range Status  ?08/02/2021 137 135 - 145 mmol/L Final  ?11/19/2020 142 134 - 144 mmol/L Final  ?   ?  ?  ? ?

## 2021-11-05 NOTE — Telephone Encounter (Signed)
Called patient to schedule OV for medication refills. No answer, LVMTCB to schedule appt . #[phone]. ?

## 2021-12-29 ENCOUNTER — Encounter: Payer: Self-pay | Admitting: Physician Assistant

## 2021-12-29 ENCOUNTER — Ambulatory Visit: Payer: No Typology Code available for payment source | Admitting: Physician Assistant

## 2021-12-29 VITALS — BP 127/84 | HR 70 | Ht 67.0 in | Wt 175.6 lb

## 2021-12-29 DIAGNOSIS — R21 Rash and other nonspecific skin eruption: Secondary | ICD-10-CM

## 2021-12-29 NOTE — Progress Notes (Signed)
    I,[name],acting as a Neurosurgeon for [employer], PA-C.,have documented all relevant documentation on the behalf of [name], PA-C,as directed by  [name], PA-C while in the presence of [name], PA-C.   Acute Office Visit  Subjective:     Patient ID: Russell Simon, male    DOB: 30-Apr-1971, 51 y.o.   MRN: [id number]  Cc. Rash  Russell Simon is a 51 y/o male with PMH of HTN who presents today with b/l lower extremity red rash that appeared earlier this week. He states it feels sensitive but denies pain, pruritus. Denies recent travel or bugbites. Denies new medication. Denies rash anywhere else on body. Reports this has happened before but not to this extent.   Review of Systems  Skin:  Positive for rash.       Objective:   Blood pressure 127/84, pulse 70, height 5\' 7"  (1.702 m), weight 175 lb 9.6 oz (79.7 kg), SpO2 100 %.   Physical Exam Skin:         Comments: B/l lower legs with petechial non blanching rash w/ some area of central clearing.  No associated edema, pruritus, palpable lesions.       No results found for any visits on 12/29/21.     Assessment & Plan:    Rash unspecified -will run coag labs -ref to derm -reviewed by Dr 02/28/22, agrees with plan  I, Sullivan Lone, PA-C have reviewed all documentation for this visit. The documentation on  12/29/2021 for the exam, diagnosis, procedures, and orders are all accurate and complete.  02/28/2022, PA-C [facility] [address] [place], [place], [place] Office: [phone] Fax: [phone]

## 2021-12-30 ENCOUNTER — Encounter: Payer: Self-pay | Admitting: Physician Assistant

## 2021-12-30 LAB — CBC WITH DIFFERENTIAL/PLATELET
Basophils Absolute: 0 10*3/uL (ref 0.0–0.2)
Basos: 1 %
EOS (ABSOLUTE): 0.2 10*3/uL (ref 0.0–0.4)
Eos: 3 %
Hematocrit: 46.3 % (ref 37.5–51.0)
Hemoglobin: 16 g/dL (ref 13.0–17.7)
Immature Grans (Abs): 0 10*3/uL (ref 0.0–0.1)
Immature Granulocytes: 0 %
Lymphocytes Absolute: 1.8 10*3/uL (ref 0.7–3.1)
Lymphs: 33 %
MCH: 30.9 pg (ref 26.6–33.0)
MCHC: 34.6 g/dL (ref 31.5–35.7)
MCV: 89 fL (ref 79–97)
Monocytes Absolute: 0.6 10*3/uL (ref 0.1–0.9)
Monocytes: 12 %
Neutrophils Absolute: 2.7 10*3/uL (ref 1.4–7.0)
Neutrophils: 51 %
Platelets: 153 10*3/uL (ref 150–450)
RBC: 5.18 x10E6/uL (ref 4.14–5.80)
RDW: 13.1 % (ref 11.6–15.4)
WBC: 5.3 10*3/uL (ref 3.4–10.8)

## 2021-12-30 LAB — PT AND PTT
INR: 1 (ref 0.9–1.2)
Prothrombin Time: 10.9 s (ref 9.1–12.0)
aPTT: 30 s (ref 24–33)

## 2022-01-17 ENCOUNTER — Other Ambulatory Visit: Payer: Self-pay | Admitting: Family Medicine

## 2022-01-17 DIAGNOSIS — I10 Essential (primary) hypertension: Secondary | ICD-10-CM

## 2022-04-09 ENCOUNTER — Other Ambulatory Visit: Payer: Self-pay | Admitting: Family Medicine

## 2022-04-09 DIAGNOSIS — I10 Essential (primary) hypertension: Secondary | ICD-10-CM

## 2022-06-22 ENCOUNTER — Emergency Department
Admission: EM | Admit: 2022-06-22 | Discharge: 2022-06-22 | Disposition: A | Discharge status: Home / Self Care | Payer: No Typology Code available for payment source | Attending: Emergency Medicine | Admitting: Emergency Medicine

## 2022-06-22 ENCOUNTER — Emergency Department: Payer: No Typology Code available for payment source

## 2022-06-22 ENCOUNTER — Other Ambulatory Visit: Payer: Self-pay

## 2022-06-22 ENCOUNTER — Encounter: Payer: Self-pay | Admitting: *Deleted

## 2022-06-22 DIAGNOSIS — I10 Essential (primary) hypertension: Secondary | ICD-10-CM | POA: Diagnosis not present

## 2022-06-22 DIAGNOSIS — R55 Syncope and collapse: Secondary | ICD-10-CM | POA: Diagnosis not present

## 2022-06-22 DIAGNOSIS — R42 Dizziness and giddiness: Secondary | ICD-10-CM | POA: Diagnosis present

## 2022-06-22 LAB — CBC
HCT: 49.3 % (ref 39.0–52.0)
Hemoglobin: 17.2 g/dL — ABNORMAL HIGH (ref 13.0–17.0)
MCH: 29.9 pg (ref 26.0–34.0)
MCHC: 34.9 g/dL (ref 30.0–36.0)
MCV: 85.7 fL (ref 80.0–100.0)
Platelets: 152 10*3/uL (ref 150–400)
RBC: 5.75 MIL/uL (ref 4.22–5.81)
RDW: 13 % (ref 11.5–15.5)
WBC: 5.2 10*3/uL (ref 4.0–10.5)
nRBC: 0 % (ref 0.0–0.2)

## 2022-06-22 LAB — BASIC METABOLIC PANEL
Anion gap: 11 (ref 5–15)
BUN: 18 mg/dL (ref 6–20)
CO2: 21 mmol/L — ABNORMAL LOW (ref 22–32)
Calcium: 9.4 mg/dL (ref 8.9–10.3)
Chloride: 105 mmol/L (ref 98–111)
Creatinine, Ser: 0.97 mg/dL (ref 0.61–1.24)
GFR, Estimated: >60 mL/min (ref >60)
Glucose, Bld: 178 mg/dL — ABNORMAL HIGH (ref 70–99)
Potassium: 3.6 mmol/L (ref 3.5–5.1)
Sodium: 137 mmol/L (ref 135–145)

## 2022-06-22 LAB — TROPONIN I (HIGH SENSITIVITY)
Troponin I (High Sensitivity): 8 ng/L (ref <18)
Troponin I (High Sensitivity): 9 ng/L (ref <18)

## 2022-06-22 MED ORDER — ONDANSETRON 4 MG PO TBDP: 4.0000 mg | ORAL_TABLET | Freq: Three times a day (TID) | ORAL | 0 refills | Status: AC | PRN

## 2022-06-22 MED ORDER — ONDANSETRON 4 MG PO TBDP
4.0000 mg | ORAL_TABLET | Freq: Once | ORAL | Status: AC
  Administered 2022-06-22: 4 mg via ORAL
  Filled 2022-06-22: qty 1

## 2022-06-22 NOTE — Discharge Instructions (Addendum)
You have been seen in the emergency department after dizziness/near syncopal episode.  Please drink plenty of fluids.  You have been prescribed a nausea medication to be used if needed.  Return to the emergency department for any return of/worsening symptoms any chest pain, trouble breathing or any other symptom personally concerning to yourself.

## 2022-06-22 NOTE — ED Triage Notes (Signed)
Pt to triage via wheelchair.  Pt has dizziness since 1900 tonight.   Pt has left side chest pain.  No n/v/  pt alert  speech clear.

## 2022-06-22 NOTE — ED Provider Notes (Signed)
[facility] Provider Note    Event Date/Time   First MD Initiated Contact with Patient 06/22/22 2150     (approximate)  History   Chief Complaint: Dizziness  HPI  Russell Simon is a 51 y.o. male with a past medical history of hypertension presents to the emergency department for a near syncopal episode with dizziness.  According to the patient he was getting out of the shower when he began sudden onset of dizziness and lightheadedness.  Patient states he then began feeling tingling all over his body and his hands cramped up.  Patient states since arriving to the emergency department his symptoms have resolved.  Denies any symptoms currently.  No headache.  No weakness or numbness of any arm or leg but did state tingling in all of his arms and legs.  Describes the dizziness more as a lightheadedness than a spinning sensation.  No chest pain.  Physical Exam   Triage Vital Signs: ED Triage Vitals  Enc Vitals Group     BP 06/22/22 2006 133/79     Pulse Rate 06/22/22 2006 83     Resp 06/22/22 2006 18     Temp 06/22/22 2006 98.3 F (36.8 C)     Temp Source 06/22/22 2006 Oral     SpO2 06/22/22 2006 100 %     Weight 06/22/22 2004 172 lb (78 kg)     Height 06/22/22 2004 5\' 7"  (1.702 m)     Head Circumference --      Peak Flow --      Pain Score 06/22/22 2004 4     Pain Loc --      Pain Edu? --      Excl. in GC? --     Most recent vital signs: Vitals:   06/22/22 2006  BP: 133/79  Pulse: 83  Resp: 18  Temp: 98.3 F (36.8 C)  SpO2: 100%    General: Awake, no distress.  CV:  Good peripheral perfusion.  Regular rate and rhythm  Resp:  Normal effort.  Equal breath sounds bilaterally.  Abd:  No distention.  Soft, nontender.  No rebound or guarding.  ED Results / Procedures / Treatments   EKG  EKG viewed and interpreted by myself shows a normal sinus rhythm at 82 bpm with a narrow QRS, normal axis, normal intervals, no concerning ST  changes.  RADIOLOGY  I have reviewed and interpreted the chest x-ray images I do not see any obvious consolidation on my evaluation. Radiology is read the chest x-ray as negative. CT scan read as negative for acute abnormality.  MEDICATIONS ORDERED IN ED: Medications - No data to display   IMPRESSION / MDM / ASSESSMENT AND PLAN / ED COURSE  I reviewed the triage vital signs and the nursing notes.  Patient's presentation is most consistent with acute presentation with potential threat to life or bodily function.  Patient presents emergency department for acute onset of dizziness diffuse paresthesias throughout his body and hand cramping.  Patient's workup thus far is reassuring including a normal CBC, normal chemistry, negative troponin.  Patient's chest x-ray is clear and EKG is reassuring.  Given the patient's acute onset of symptoms we will proceed with a CT image of the head as a precaution to rule out intracranial abnormality.  Overall patient's symptoms are more suggestive of near syncopal event possibly induced by the hot shower and vasodilation, possibly followed by more of a anxiety response leading to hypocarbia paresthesias and  hand cramping.  I believe if the patient CT scan is normal and the patient could be safely discharged home and follow-up with his primary care doctor.  Patient is also in agreement with this.  Patient CT scan is negative.  Given the patient's reassuring workup I believe the patient will be safe for discharge home and outpatient follow-up.  Patient does describe some mild intermittent nausea we will prescribe nausea medication for the patient.  Discussed return precautions.  FINAL CLINICAL IMPRESSION(S) / ED DIAGNOSES   Near syncope    Note:  This document was prepared using Dragon voice recognition software and may include unintentional dictation errors.   Minna Antis, MD 06/22/22 [phone]

## 2022-06-23 ENCOUNTER — Telehealth: Payer: Self-pay

## 2022-06-23 NOTE — Telephone Encounter (Signed)
Transition Care Management Unsuccessful Follow-up Telephone Call  Date of discharge and from where:  [place] [facility] 06/22/2022  Attempts:  2nd Attempt  Reason for unsuccessful TCM follow-up call:  Left voice message Karena Addison, LPN [facility] Nurse Health Advisor Direct Dial [phone]                       Transition Care Management Unsuccessful Follow-up Telephone Call  Date of discharge and from where:  [place] [facility] 06/22/2022  Attempts:  1st Attempt  Reason for unsuccessful TCM follow-up call:  Left voice message Karena Addison, LPN [facility] Nurse Health Advisor Direct Dial [phone]

## 2022-06-27 NOTE — Telephone Encounter (Signed)
Transition Care Management Unsuccessful Follow-up Telephone Call  Date of discharge and from where:  [place] [facility] 06/22/2022  Attempts:  3rd Attempt  Reason for unsuccessful TCM follow-up call:  Left voice message Karena Addison, LPN [facility] Nurse Health Advisor Direct Dial [phone]

## 2022-07-05 ENCOUNTER — Other Ambulatory Visit: Payer: Self-pay | Admitting: Family Medicine

## 2022-07-05 DIAGNOSIS — I10 Essential (primary) hypertension: Secondary | ICD-10-CM

## 2022-07-05 NOTE — Telephone Encounter (Signed)
Requested medication (s) are due for refill today: Yes  Requested medication (s) are on the active medication list: Yes  Last refill:  04/11/22  Future visit scheduled: No  Notes to clinic:  Left pt. Message to call and make appointment.    Requested Prescriptions  Pending Prescriptions Disp Refills   hydrochlorothiazide (HYDRODIURIL) 25 MG tablet [Pharmacy Med Name: HYDROCHLOROTHIAZIDE 25 MG TAB] 90 tablet 0    Sig: TAKE 1 TABLET (25 MG TOTAL) BY MOUTH DAILY. Please schedule office visit before any future refill.     Cardiovascular: Diuretics - Thiazide Failed - 07/05/2022  8:32 AM      Failed - Valid encounter within last 6 months    Recent Outpatient Visits           6 months ago Rash   [facility] [name], PA-C   1 year ago Ganglion cyst of wrist, left   [facility] Maple Hudson., MD   1 year ago Watery eyes   [facility] [place], Jodell Cipro, PA-C   1 year ago Swelling of both ankles   [employer], Jodell Cipro, PA-C   1 year ago Essential hypertension   [facility] Osvaldo Angst [place], [place]              Passed - Cr in normal range and within 180 days    Creat  Date Value Ref Range Status  04/03/2017 1.18 0.60 - 1.35 mg/dL Final   Creatinine, Ser  Date Value Ref Range Status  06/22/2022 0.97 0.61 - 1.24 mg/dL Final         Passed - K in normal range and within 180 days    Potassium  Date Value Ref Range Status  06/22/2022 3.6 3.5 - 5.1 mmol/L Final         Passed - Na in normal range and within 180 days    Sodium  Date Value Ref Range Status  06/22/2022 137 135 - 145 mmol/L Final  11/19/2020 142 134 - 144 mmol/L Final         Passed - Last BP in normal range    BP Readings from Last 1 Encounters:  06/22/22 133/79

## 2022-07-14 NOTE — Progress Notes (Signed)
I,Sulibeya S Dimas,acting as a Neurosurgeon for Shirlee Latch, MD.,have documented all relevant documentation on the behalf of Shirlee Latch, MD,as directed by  Shirlee Latch, MD while in the presence of Shirlee Latch, MD.     Established patient visit   Patient: Russell Simon   DOB: 12-11-1970   51 y.o. Male  MRN: [id number] Visit Date: 07/15/2022  Today's healthcare provider: Shirlee Latch, MD   Chief Complaint  Patient presents with   Hypertension   Subjective    HPI  Hypertension, follow-up  BP Readings from Last 3 Encounters:  07/15/22 127/84  06/22/22 133/79  12/29/21 127/84   Wt Readings from Last 3 Encounters:  07/15/22 169 lb (76.7 kg)  06/22/22 172 lb (78 kg)  12/29/21 175 lb 9.6 oz (79.7 kg)     He was last seen for hypertension 2 years ago.  BP at that visit was 135/93. Management since that visit includes increase amoldipine to 10 mg daily.  He reports excellent compliance with treatment. He is not having side effects.   Outside blood pressures are not being checked.  Pertinent labs Lab Results  Component Value Date   CHOL 153 12/03/2019   HDL 37 (L) 12/03/2019   LDLCALC 84 12/03/2019   TRIG 158 (H) 12/03/2019   CHOLHDL 4.1 12/03/2019   Lab Results  Component Value Date   NA 137 06/22/2022   K 3.6 06/22/2022   CREATININE 0.97 06/22/2022   GFRNONAA >60 06/22/2022   GLUCOSE 178 (H) 06/22/2022   TSH 1.260 11/19/2020     The 10-year ASCVD risk score (Arnett DK, et al., 2019) is: 3.4%  ---------------------------------------------------------------------------------------------------   Medications: Outpatient Medications Prior to Visit  Medication Sig   aspirin 81 MG EC tablet Take 81 mg by mouth once.   isosorbide mononitrate (IMDUR) 60 MG 24 hr tablet Take 60 mg by mouth daily.   meloxicam (MOBIC) 15 MG tablet Take 15 mg by mouth daily as needed for pain.    nitroGLYCERIN (NITROSTAT) 0.4 MG SL tablet Place 1 tablet  (0.4 mg total) under the tongue every 5 (five) minutes as needed for chest pain.   ondansetron (ZOFRAN-ODT) 4 MG disintegrating tablet Take 1 tablet (4 mg total) by mouth every 8 (eight) hours as needed for nausea or vomiting.   rosuvastatin (CRESTOR) 20 MG tablet Take 20 mg by mouth daily.   [DISCONTINUED] amLODipine (NORVASC) 10 MG tablet Take 1 tablet (10 mg total) by mouth daily.   [DISCONTINUED] hydrochlorothiazide (HYDRODIURIL) 25 MG tablet TAKE 1 TABLET (25 MG TOTAL) BY MOUTH DAILY. PLEASE SCHEDULE OFFICE VISIT BEFORE ANY FUTURE REFILL.   [DISCONTINUED] rosuvastatin (CRESTOR) 10 MG tablet Take 10 mg by mouth daily.   No facility-administered medications prior to visit.    Review of Systems  Constitutional:  Negative for appetite change and fatigue.  Respiratory:  Positive for chest tightness. Negative for cough and shortness of breath.   Cardiovascular:  Negative for chest pain and leg swelling.  Gastrointestinal:  Negative for abdominal pain, nausea and vomiting.  Neurological:  Negative for dizziness, light-headedness and headaches.       Objective    BP 127/84 (BP Location: Left Arm, Patient Position: Sitting, Cuff Size: Large)   Pulse 86   Temp 98.5 F (36.9 C) (Oral)   Resp 16   Wt 169 lb (76.7 kg)   BMI 26.47 kg/m  BP Readings from Last 3 Encounters:  07/15/22 127/84  06/22/22 133/79  12/29/21 127/84   Wt Readings from  Last 3 Encounters:  07/15/22 169 lb (76.7 kg)  06/22/22 172 lb (78 kg)  12/29/21 175 lb 9.6 oz (79.7 kg)      Physical Exam Vitals reviewed.  Constitutional:      General: He is not in acute distress.    Appearance: Normal appearance. He is not diaphoretic.  HENT:     Head: Normocephalic and atraumatic.  Eyes:     General: No scleral icterus.    Conjunctiva/sclera: Conjunctivae normal.  Cardiovascular:     Rate and Rhythm: Normal rate and regular rhythm.     Pulses: Normal pulses.     Heart sounds: Normal heart sounds. No murmur  heard. Pulmonary:     Effort: Pulmonary effort is normal. No respiratory distress.     Breath sounds: Normal breath sounds. No wheezing or rhonchi.  Musculoskeletal:     Cervical back: Neck supple.     Right lower leg: No edema.     Left lower leg: No edema.  Lymphadenopathy:     Cervical: No cervical adenopathy.  Skin:    General: Skin is warm and dry.     Findings: No rash.  Neurological:     Mental Status: He is alert and oriented to person, place, and time. Mental status is at baseline.  Psychiatric:        Mood and Affect: Mood normal.        Behavior: Behavior normal.       No results found for any visits on 07/15/22.  Assessment & Plan     Problem List Items Addressed This Visit       Cardiovascular and Mediastinum   Essential hypertension - Primary    Well controlled Continue current medications Recheck metabolic panel F/u in 6 months       Relevant Medications   amLODipine (NORVASC) 10 MG tablet   hydrochlorothiazide (HYDRODIURIL) 25 MG tablet   Other Relevant Orders   Comprehensive metabolic panel   Coronary artery disease of native artery of native heart with stable angina pectoris (HCC)    F/b cardiology Continue statin, imdur, nitro prn      Relevant Medications   amLODipine (NORVASC) 10 MG tablet   hydrochlorothiazide (HYDRODIURIL) 25 MG tablet     Other   Mixed hyperlipidemia    Previously well controlled Continue statin Repeat FLP and CMP Goal LDL < 70      Relevant Medications   amLODipine (NORVASC) 10 MG tablet   hydrochlorothiazide (HYDRODIURIL) 25 MG tablet   Other Relevant Orders   Lipid panel   Comprehensive metabolic panel   Other Visit Diagnoses     Hyperglycemia       Relevant Orders   Hemoglobin A1c        Return in about 6 months (around 01/14/2023) for CPE.      I, Shirlee Latch, MD, have reviewed all documentation for this visit. The documentation on 07/15/22 for the exam, diagnosis, procedures, and orders  are all accurate and complete.   Ayomikun Starling, Marzella Schlein, MD, MPH [facility]

## 2022-07-15 ENCOUNTER — Ambulatory Visit: Payer: No Typology Code available for payment source | Admitting: Family Medicine

## 2022-07-15 ENCOUNTER — Encounter: Payer: Self-pay | Admitting: Family Medicine

## 2022-07-15 VITALS — BP 127/84 | HR 86 | Temp 98.5°F | Resp 16 | Wt 169.0 lb

## 2022-07-15 DIAGNOSIS — I25118 Atherosclerotic heart disease of native coronary artery with other forms of angina pectoris: Secondary | ICD-10-CM | POA: Diagnosis not present

## 2022-07-15 DIAGNOSIS — R739 Hyperglycemia, unspecified: Secondary | ICD-10-CM | POA: Diagnosis not present

## 2022-07-15 DIAGNOSIS — E782 Mixed hyperlipidemia: Secondary | ICD-10-CM

## 2022-07-15 DIAGNOSIS — I10 Essential (primary) hypertension: Secondary | ICD-10-CM | POA: Diagnosis not present

## 2022-07-15 MED ORDER — AMLODIPINE BESYLATE 10 MG PO TABS: 10.0000 mg | ORAL_TABLET | Freq: Every day | ORAL | 1 refills | Status: DC

## 2022-07-15 MED ORDER — HYDROCHLOROTHIAZIDE 25 MG PO TABS: 25.0000 mg | ORAL_TABLET | Freq: Every day | ORAL | 1 refills | Status: DC

## 2022-07-15 NOTE — Assessment & Plan Note (Signed)
F/b cardiology Continue statin, imdur, nitro prn

## 2022-07-15 NOTE — Assessment & Plan Note (Signed)
Previously well controlled Continue statin Repeat FLP and CMP Goal LDL < 70 

## 2022-07-15 NOTE — Assessment & Plan Note (Signed)
Well controlled Continue current medications Recheck metabolic panel F/u in 6 months  

## 2022-07-16 LAB — COMPREHENSIVE METABOLIC PANEL
ALT: 26 IU/L (ref 0–44)
AST: 22 IU/L (ref 0–40)
Albumin/Globulin Ratio: 2.3 — ABNORMAL HIGH (ref 1.2–2.2)
Albumin: 4.9 g/dL (ref 4.1–5.1)
Alkaline Phosphatase: 70 IU/L (ref 44–121)
BUN/Creatinine Ratio: 14 (ref 9–20)
BUN: 15 mg/dL (ref 6–24)
Bilirubin Total: 0.7 mg/dL (ref 0.0–1.2)
CO2: 25 mmol/L (ref 20–29)
Calcium: 10 mg/dL (ref 8.7–10.2)
Chloride: 99 mmol/L (ref 96–106)
Creatinine, Ser: 1.04 mg/dL (ref 0.76–1.27)
Globulin, Total: 2.1 g/dL (ref 1.5–4.5)
Glucose: 98 mg/dL (ref 70–99)
Potassium: 3.9 mmol/L (ref 3.5–5.2)
Sodium: 139 mmol/L (ref 134–144)
Total Protein: 7 g/dL (ref 6.0–8.5)
eGFR: 88 mL/min/{1.73_m2} (ref >59)

## 2022-07-16 LAB — LIPID PANEL
Chol/HDL Ratio: 1.6 ratio (ref 0.0–5.0)
Cholesterol, Total: 100 mg/dL (ref 100–199)
HDL: 61 mg/dL (ref >39)
LDL Chol Calc (NIH): 25 mg/dL (ref 0–99)
Triglycerides: 64 mg/dL (ref 0–149)
VLDL Cholesterol Cal: 14 mg/dL (ref 5–40)

## 2022-07-16 LAB — HEMOGLOBIN A1C
Est. average glucose Bld gHb Est-mCnc: 114 mg/dL
Hgb A1c MFr Bld: 5.6 % (ref 4.8–5.6)

## 2022-08-16 ENCOUNTER — Encounter: Payer: Self-pay | Admitting: Family Medicine

## 2022-08-16 DIAGNOSIS — I10 Essential (primary) hypertension: Secondary | ICD-10-CM

## 2022-08-16 MED ORDER — HYDROCHLOROTHIAZIDE 25 MG PO TABS: 25.0000 mg | ORAL_TABLET | Freq: Every day | ORAL | 0 refills | Status: AC

## 2022-08-16 MED ORDER — AMLODIPINE BESYLATE 10 MG PO TABS: 10.0000 mg | ORAL_TABLET | Freq: Every day | ORAL | 0 refills | Status: DC

## 2022-08-16 MED ORDER — ROSUVASTATIN CALCIUM 20 MG PO TABS: 20.0000 mg | ORAL_TABLET | Freq: Every day | ORAL | 0 refills | Status: AC

## 2022-11-13 IMAGING — CR DG CHEST 2V
2 series · 2 of 2 positions shown · non-contrast
Comparison: December 02, 2019.

CLINICAL DATA: A 50-year-old male presents with increasing chest
pressure.

EXAM:
CHEST - 2 VIEW

[chest pa]
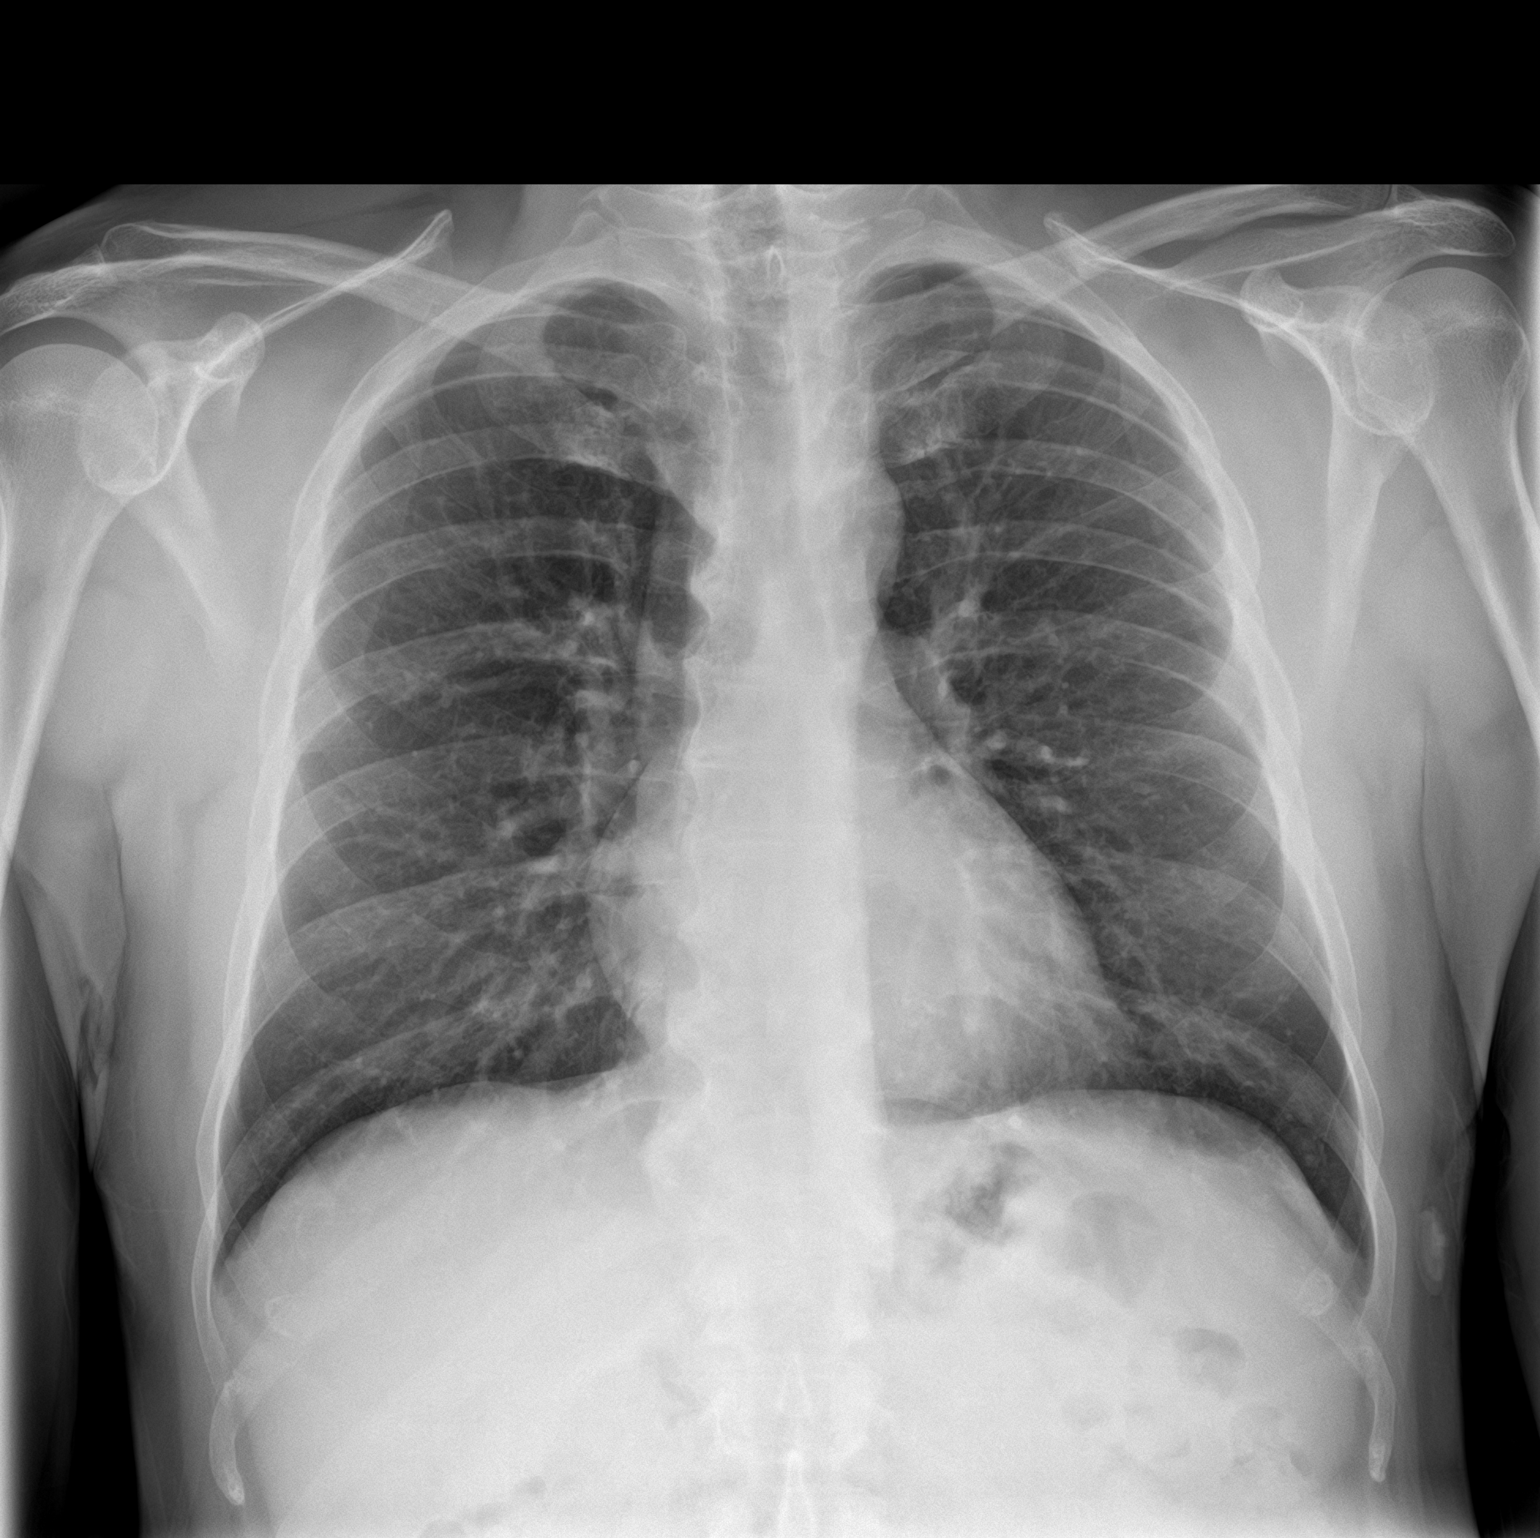

[chest lat]
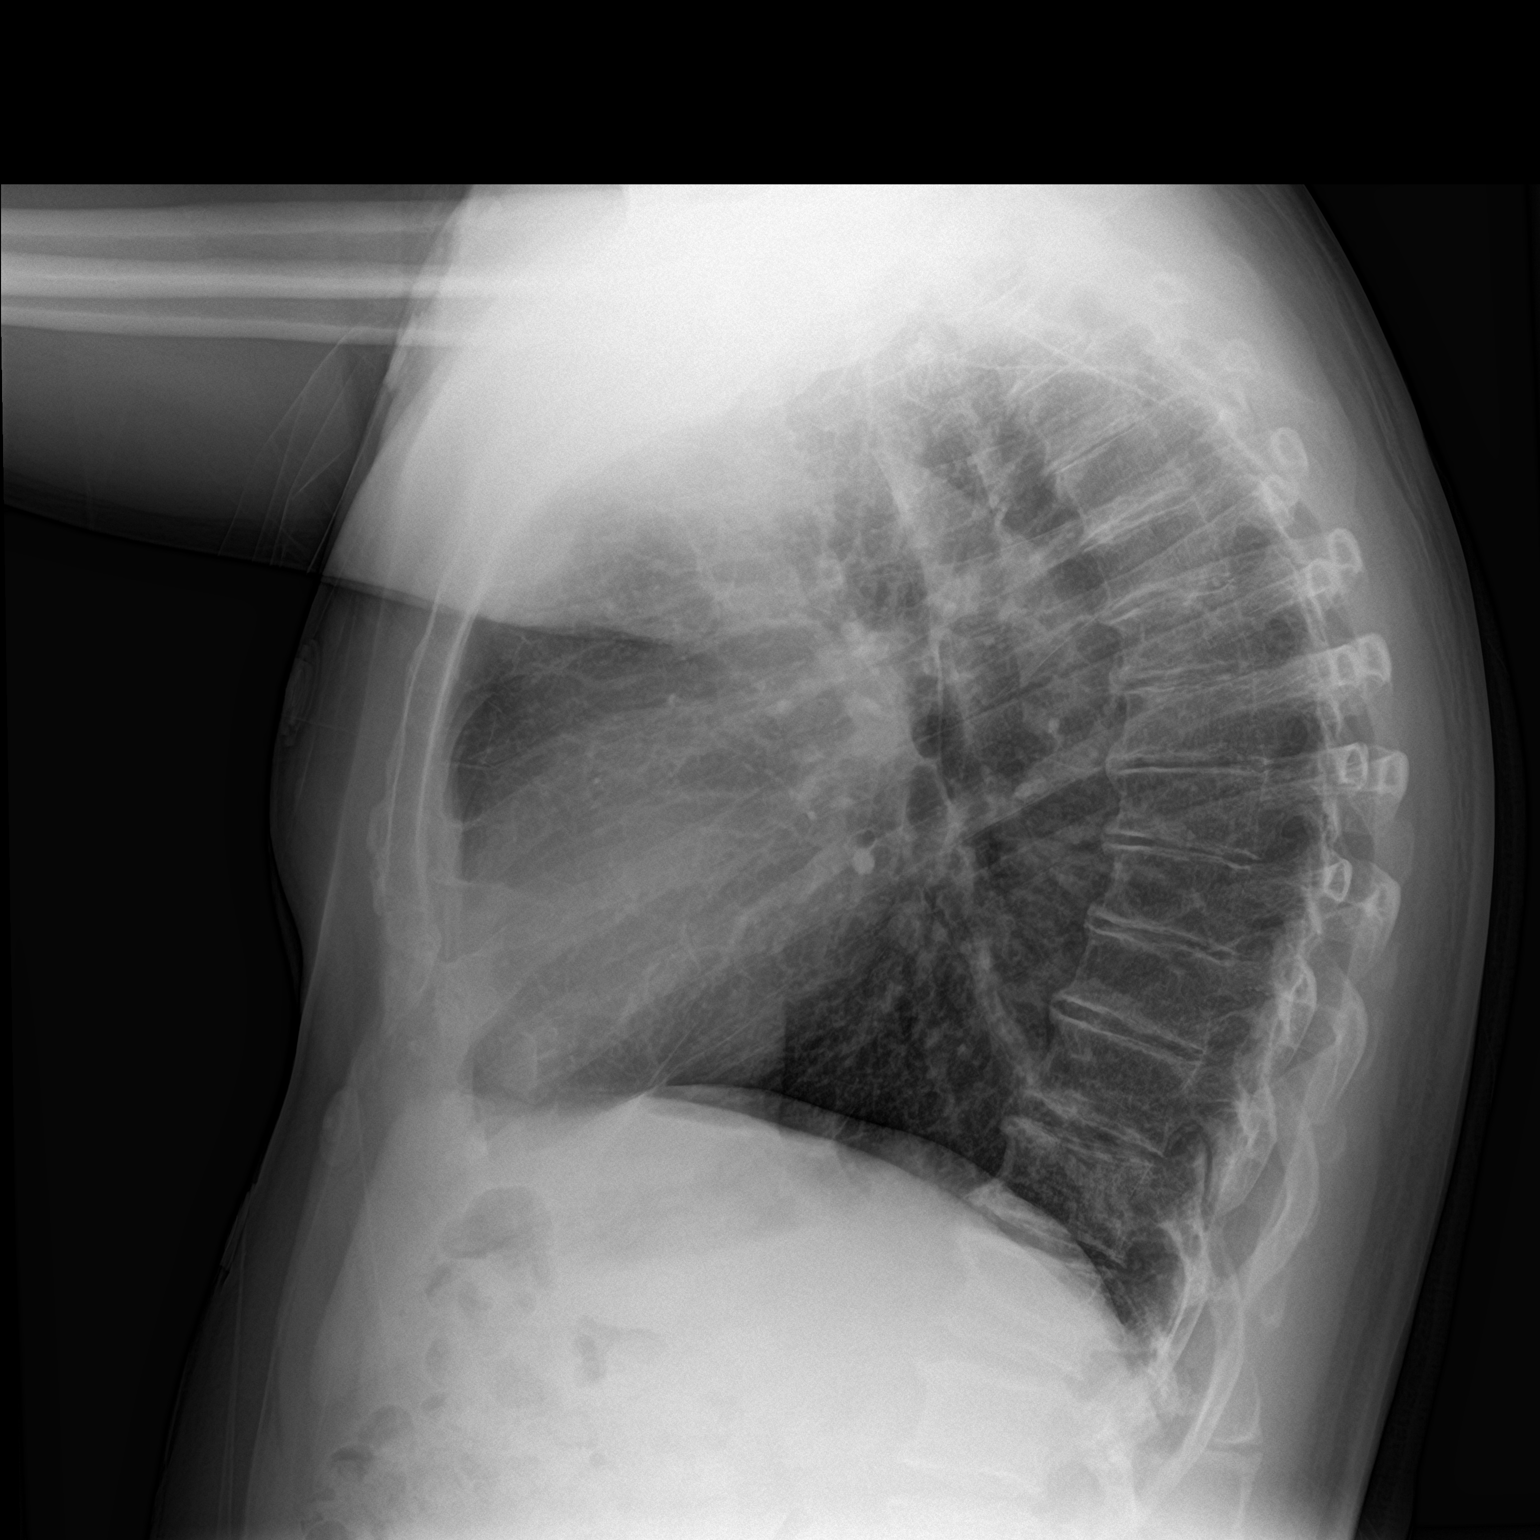

[2 of 2 positions shown; findings below may reference images not displayed]

FINDINGS: Trachea midline.

Cardiomediastinal contours and hilar structures are normal. Lungs
are clear. No sign of pleural effusion.

On limited assessment there is no acute skeletal process.
IMPRESSION: No acute cardiopulmonary disease.

## 2023-01-13 ENCOUNTER — Telehealth: Payer: Self-pay | Admitting: Family Medicine

## 2023-01-13 NOTE — Telephone Encounter (Signed)
[facility] pharmacy faxed refill request for the following medications:   amLODipine (NORVASC) 10 MG tablet     Please advise  

## 2023-01-16 ENCOUNTER — Other Ambulatory Visit: Payer: Self-pay

## 2023-01-16 ENCOUNTER — Telehealth: Payer: Self-pay | Admitting: Family Medicine

## 2023-01-16 DIAGNOSIS — I10 Essential (primary) hypertension: Secondary | ICD-10-CM

## 2023-01-16 MED ORDER — AMLODIPINE BESYLATE 10 MG PO TABS: 10.0000 mg | ORAL_TABLET | Freq: Every day | ORAL | 0 refills | Status: DC

## 2023-01-16 MED ORDER — AMLODIPINE BESYLATE 10 MG PO TABS: 10.0000 mg | ORAL_TABLET | Freq: Every day | ORAL | 0 refills | Status: AC

## 2023-01-16 NOTE — Telephone Encounter (Signed)
[facility] pharmacy is requesting prescription refill amLODipine (NORVASC) 10 MG tablet   Please advise

## 2023-08-25 ENCOUNTER — Telehealth: Payer: Self-pay | Admitting: Family Medicine

## 2023-08-25 NOTE — Telephone Encounter (Signed)
[facility] faxed refill request for the following medications:  rosuvastatin (CRESTOR) 20 MG tablet   Please advise.  

## 2023-08-25 NOTE — Telephone Encounter (Signed)
Pt needs appointment. Last seen in office on 07/15/22

## 2024-02-20 ENCOUNTER — Other Ambulatory Visit: Payer: Self-pay | Admitting: Family Medicine

## 2024-02-20 DIAGNOSIS — I10 Essential (primary) hypertension: Secondary | ICD-10-CM

## 2024-02-20 NOTE — Telephone Encounter (Signed)
[facility] faxed refill request for the following medications: ° °amLODipine (NORVASC) 10 MG tablet  ° ° °Please advise. °

## 2024-02-21 NOTE — Telephone Encounter (Signed)
 Last filled 1 yr ago for 90 day supply. Patient needs to be seen for ongoing chronic care management.
